# Patient Record
Sex: Female | Born: 1980 | Race: White | Hispanic: No | Marital: Single | Smoking: Former smoker
Health system: Southern US, Community
[De-identification: ages and names within clinical notes are randomized; demographics above are authoritative.]

## PROBLEM LIST (undated history)

## (undated) DIAGNOSIS — F32A Depression, unspecified: Secondary | ICD-10-CM

## (undated) DIAGNOSIS — F329 Major depressive disorder, single episode, unspecified: Secondary | ICD-10-CM

## (undated) DIAGNOSIS — F1011 Alcohol abuse, in remission: Secondary | ICD-10-CM

## (undated) DIAGNOSIS — M1990 Unspecified osteoarthritis, unspecified site: Secondary | ICD-10-CM

## (undated) DIAGNOSIS — F419 Anxiety disorder, unspecified: Secondary | ICD-10-CM

## (undated) HISTORY — DX: Alcohol abuse, in remission: F10.11

## (undated) HISTORY — DX: Unspecified osteoarthritis, unspecified site: M19.90

## (undated) HISTORY — DX: Major depressive disorder, single episode, unspecified: F32.9

## (undated) HISTORY — DX: Anxiety disorder, unspecified: F41.9

## (undated) HISTORY — PX: TUBAL LIGATION: SHX77

## (undated) HISTORY — DX: Depression, unspecified: F32.A

---
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
## 2004-08-11 ENCOUNTER — Emergency Department: Payer: Self-pay | Admitting: Emergency Medicine

## 2004-09-27 ENCOUNTER — Ambulatory Visit: Payer: Self-pay

## 2004-12-16 ENCOUNTER — Inpatient Hospital Stay: Payer: Self-pay

## 2004-12-25 ENCOUNTER — Ambulatory Visit: Payer: Self-pay | Admitting: Unknown Physician Specialty

## 2005-06-18 ENCOUNTER — Emergency Department: Payer: Self-pay | Admitting: Emergency Medicine

## 2005-08-16 ENCOUNTER — Emergency Department: Payer: Self-pay | Admitting: Emergency Medicine

## 2006-01-05 ENCOUNTER — Emergency Department: Payer: Self-pay | Admitting: Emergency Medicine

## 2006-01-07 ENCOUNTER — Ambulatory Visit: Payer: Self-pay | Admitting: Unknown Physician Specialty

## 2006-05-06 ENCOUNTER — Emergency Department: Payer: Self-pay | Admitting: Unknown Physician Specialty

## 2011-07-10 ENCOUNTER — Emergency Department: Payer: Self-pay | Admitting: Unknown Physician Specialty

## 2015-12-16 ENCOUNTER — Encounter: Payer: Self-pay | Admitting: Emergency Medicine

## 2015-12-16 ENCOUNTER — Emergency Department: Payer: Medicaid Other

## 2015-12-16 ENCOUNTER — Emergency Department
Admission: EM | Admit: 2015-12-16 | Discharge: 2015-12-16 | Disposition: A | Discharge status: Home / Self Care | Payer: Medicaid Other | Attending: Emergency Medicine | Admitting: Emergency Medicine

## 2015-12-16 DIAGNOSIS — F17200 Nicotine dependence, unspecified, uncomplicated: Secondary | ICD-10-CM | POA: Diagnosis not present

## 2015-12-16 DIAGNOSIS — Y999 Unspecified external cause status: Secondary | ICD-10-CM | POA: Diagnosis not present

## 2015-12-16 DIAGNOSIS — S82231A Displaced oblique fracture of shaft of right tibia, initial encounter for closed fracture: Secondary | ICD-10-CM | POA: Insufficient documentation

## 2015-12-16 DIAGNOSIS — X501XXA Overexertion from prolonged static or awkward postures, initial encounter: Secondary | ICD-10-CM | POA: Insufficient documentation

## 2015-12-16 DIAGNOSIS — Y929 Unspecified place or not applicable: Secondary | ICD-10-CM | POA: Insufficient documentation

## 2015-12-16 DIAGNOSIS — S8991XA Unspecified injury of right lower leg, initial encounter: Secondary | ICD-10-CM | POA: Diagnosis present

## 2015-12-16 DIAGNOSIS — S82201A Unspecified fracture of shaft of right tibia, initial encounter for closed fracture: Secondary | ICD-10-CM

## 2015-12-16 DIAGNOSIS — Y939 Activity, unspecified: Secondary | ICD-10-CM | POA: Insufficient documentation

## 2015-12-16 DIAGNOSIS — S82401A Unspecified fracture of shaft of right fibula, initial encounter for closed fracture: Secondary | ICD-10-CM

## 2015-12-16 DIAGNOSIS — S82431A Displaced oblique fracture of shaft of right fibula, initial encounter for closed fracture: Secondary | ICD-10-CM | POA: Insufficient documentation

## 2015-12-16 MED ORDER — HYDROMORPHONE HCL 1 MG/ML IJ SOLN
1.0000 mg | Freq: Once | INTRAMUSCULAR | Status: AC
  Administered 2015-12-16: 1 mg via INTRAMUSCULAR
  Filled 2015-12-16: qty 1

## 2015-12-16 MED ORDER — ONDANSETRON 4 MG PO TBDP
4.0000 mg | ORAL_TABLET | Freq: Once | ORAL | Status: AC
  Administered 2015-12-16: 4 mg via ORAL
  Filled 2015-12-16: qty 1

## 2015-12-16 MED ORDER — HYDROMORPHONE HCL 1 MG/ML IJ SOLN
1.0000 mg | Freq: Once | INTRAMUSCULAR | Status: AC
  Administered 2015-12-16: 1 mg via INTRAMUSCULAR

## 2015-12-16 MED ORDER — OXYCODONE-ACETAMINOPHEN 5-325 MG PO TABS: 1.0000 | ORAL_TABLET | Freq: Four times a day (QID) | ORAL | Status: DC | PRN

## 2015-12-16 MED ORDER — HYDROMORPHONE HCL 1 MG/ML IJ SOLN
INTRAMUSCULAR | Status: AC
  Administered 2015-12-16: 1 mg via INTRAMUSCULAR
  Filled 2015-12-16: qty 1

## 2015-12-16 NOTE — ED Provider Notes (Signed)
CSN: [id number]     Arrival date & time 12/16/15  1952 History   First MD Initiated Contact with Patient 12/16/15 2030     Chief Complaint  Patient presents with  . Leg Injury    rt     HPI  [age] year old female who presents to the emergency department for evaluation of right leg pain. She was getting off of her pony when her shoe got stuck and some fresh asphalt, the pony turned and caused her leg to twist but her foot remained planted. She has been unable to bear weight. She denies previous injury. She has not taken anything for pain. She ate just prior to the incident, which was approximately an hour ago.  No past medical history on file. No past surgical history on file. No family history on file. Social History  Substance Use Topics  . Smoking status: Current Every Day Smoker -- 1.00 packs/day  . Smokeless tobacco: Not on file  . Alcohol Use: No     Comment: socially   OB History    No data available     Review of Systems  Constitutional: Negative.   Respiratory: Negative.   Musculoskeletal: Positive for myalgias and gait problem.  Neurological: Negative.       Allergies  Review of patient's allergies indicates no known allergies.  Home Medications   Prior to Admission medications   Medication Sig Start Date End Date Taking? Authorizing Provider  oxyCODONE-acetaminophen (ROXICET) 5-325 MG tablet Take 1-2 tablets by mouth every 6 (six) hours as needed. 12/16/15   Eudell Julian B Tykira Wachs, FNP   BP 126/84 mmHg  Pulse 105  Temp(Src) 97.8 F (36.6 C)  Resp 18  Ht 5\' 2"  (1.575 m)  Wt 70.308 kg  BMI 28.34 kg/m2  SpO2 99%  LMP 12/10/2015 Physical Exam  Constitutional: She appears well-developed and well-nourished.  HENT:  Head: Atraumatic.  Neck: Normal range of motion.  Pulmonary/Chest: Effort normal.  Musculoskeletal: She exhibits edema and tenderness.       Right knee: She exhibits decreased range of motion and bony tenderness.       Right ankle: She exhibits  decreased range of motion and swelling. She exhibits no laceration and normal pulse. Tenderness. Proximal fibula tenderness found.       Legs: Vitals reviewed.   ED Course  .Splint Application Date/Time: 12/16/2015 9:48 PM Performed by: Maisie FusHOMAS, DAVID C Authorized by: Kem BoroughsRIPLETT, Rosiland Sen B Consent: Verbal consent obtained. Location details: right leg Splint type: sugar tong (Stirrup--knee to foot to knee) Post-procedure: The splinted body part was neurovascularly unchanged following the procedure.   (including critical care time) Labs Review Labs Reviewed - No data to display  Imaging Review Dg Tibia/fibula Right  12/16/2015  CLINICAL DATA:  Pain after getting off bony and twisting leg tonight. EXAM: RIGHT TIBIA AND FIBULA - 2 VIEW COMPARISON:  None. FINDINGS: There is a displaced oblique fracture of the proximal fibular diaphysis. There is also a displaced oblique fracture of the distal tibial diaphysis. There is 1/3 shaft's with of lateral displacement of the distal tibial fragment. Remainder the exam is within normal. IMPRESSION: Displaced oblique fracture of the distal tibial diaphysis and displaced oblique fracture of the proximal fibular diaphysis. Electronically Signed   By: Elberta Fortisaniel  Boyle M.D.   On: 12/16/2015 20:54   I have personally reviewed and evaluated these images and lab results as part of my medical decision-making. Initial fracture care was provided follow-up will be greater than 24 hours.   EKG  Interpretation None      MDM  Consulted with Dr. Rosita Kea who advises sugartong OCL and follow up Monday morning.  Radiology results and plan discussed with patient and family. She is aware to remain non weight bearing, elevate, and use ice. She will be given a prescription for Percocet to be taken as needed for pain. She is aware to call first thing Monday morning to be seen by Dr. Rosita Kea. Strict return precautions were given to the patient and her mother.  Final diagnoses:  Tibia  fracture, right, closed, initial encounter  Fibula fracture, right, closed, initial encounter        Chinita Pester, FNP 12/16/15 2154  Jeanmarie Plant, MD 12/16/15 2241

## 2015-12-16 NOTE — ED Notes (Signed)
Getting off horse and fell and foot twist on the ground and heard leg pop

## 2015-12-16 NOTE — Discharge Instructions (Signed)
Cast or Splint Care °Casts and splints support injured limbs and keep bones from moving while they heal.  °HOME CARE °· Keep the cast or splint uncovered during the drying period. °¨ A plaster cast can take 24 to 48 hours to dry. °¨ A fiberglass cast will dry in less than 1 hour. °· Do not rest the cast on anything harder than a pillow for 24 hours. °· Do not put weight on your injured limb. Do not put pressure on the cast. Wait for your doctor's approval. °· Keep the cast or splint dry. °¨ Cover the cast or splint with a plastic bag during baths or wet weather. °¨ If you have a cast over your chest and belly (trunk), take sponge baths until the cast is taken off. °¨ If your cast gets wet, dry it with a towel or blow dryer. Use the cool setting on the blow dryer. °· Keep your cast or splint clean. Wash a dirty cast with a damp cloth. °· Do not put any objects under your cast or splint. °· Do not scratch the skin under the cast with an object. If itching is a problem, use a blow dryer on a cool setting over the itchy area. °· Do not trim or cut your cast. °· Do not take out the padding from inside your cast. °· Exercise your joints near the cast as told by your doctor. °· Raise (elevate) your injured limb on 1 or 2 pillows for the first 1 to 3 days. °GET HELP IF: °· Your cast or splint cracks. °· Your cast or splint is too tight or too loose. °· You itch badly under the cast. °· Your cast gets wet or has a soft spot. °· You have a bad smell coming from the cast. °· You get an object stuck under the cast. °· Your skin around the cast becomes red or sore. °· You have new or more pain after the cast is put on. °GET HELP RIGHT AWAY IF: °· You have fluid leaking through the cast. °· You cannot move your fingers or toes. °· Your fingers or toes turn blue or white or are cool, painful, or puffy (swollen). °· You have tingling or lose feeling (numbness) around the injured area. °· You have bad pain or pressure under the  cast. °· You have trouble breathing or have shortness of breath. °· You have chest pain. °  °This information is not intended to replace advice given to you by your health care provider. Make sure you discuss any questions you have with your health care provider. °  °Document Released: 01/02/2011 Document Revised: 05/05/2013 Document Reviewed: 03/11/2013 °Elsevier Interactive Patient Education ©2016 [employer] ° °

## 2015-12-19 ENCOUNTER — Encounter
Admission: RE | Disposition: A | Discharge status: Home / Self Care | Payer: Self-pay | Source: Ambulatory Visit | Attending: Orthopedic Surgery

## 2015-12-19 ENCOUNTER — Ambulatory Visit: Payer: Medicaid Other | Admitting: Certified Registered Nurse Anesthetist

## 2015-12-19 ENCOUNTER — Ambulatory Visit: Payer: Medicaid Other

## 2015-12-19 ENCOUNTER — Encounter: Payer: Self-pay | Admitting: *Deleted

## 2015-12-19 ENCOUNTER — Observation Stay
Admission: RE | Admit: 2015-12-19 | Discharge: 2015-12-20 | Disposition: A | Discharge status: Home / Self Care | Payer: Medicaid Other | Source: Ambulatory Visit | Attending: Orthopedic Surgery | Admitting: Orthopedic Surgery

## 2015-12-19 DIAGNOSIS — F17210 Nicotine dependence, cigarettes, uncomplicated: Secondary | ICD-10-CM | POA: Insufficient documentation

## 2015-12-19 DIAGNOSIS — S82401A Unspecified fracture of shaft of right fibula, initial encounter for closed fracture: Secondary | ICD-10-CM | POA: Insufficient documentation

## 2015-12-19 DIAGNOSIS — S82201A Unspecified fracture of shaft of right tibia, initial encounter for closed fracture: Secondary | ICD-10-CM | POA: Diagnosis not present

## 2015-12-19 DIAGNOSIS — Z79891 Long term (current) use of opiate analgesic: Secondary | ICD-10-CM | POA: Diagnosis not present

## 2015-12-19 DIAGNOSIS — T148XXA Other injury of unspecified body region, initial encounter: Secondary | ICD-10-CM

## 2015-12-19 HISTORY — PX: TIBIA IM NAIL INSERTION: SHX2516

## 2015-12-19 LAB — CBC
HCT: 38.5 % (ref 35.0–47.0)
Hemoglobin: 13.1 g/dL (ref 12.0–16.0)
MCH: 34.1 pg — ABNORMAL HIGH (ref 26.0–34.0)
MCHC: 34.1 g/dL (ref 32.0–36.0)
MCV: 99.8 fL (ref 80.0–100.0)
PLATELETS: 273 10*3/uL (ref 150–440)
RBC: 3.86 MIL/uL (ref 3.80–5.20)
RDW: 12.8 % (ref 11.5–14.5)
WBC: 15.6 10*3/uL — AB (ref 3.6–11.0)

## 2015-12-19 LAB — CREATININE, SERUM
Creatinine, Ser: 0.64 mg/dL (ref 0.44–1.00)
GFR calc non Af Amer: >60 mL/min (ref >60)

## 2015-12-19 LAB — POCT PREGNANCY, URINE: PREG TEST UR: NEGATIVE

## 2015-12-19 SURGERY — INSERTION, INTRAMEDULLARY ROD, TIBIA
Anesthesia: General | Site: Leg Lower | Laterality: Right | Wound class: Clean

## 2015-12-19 MED ORDER — HYDROMORPHONE HCL 1 MG/ML IJ SOLN
INTRAMUSCULAR | Status: AC
  Administered 2015-12-19: 0.25 mg via INTRAVENOUS
  Filled 2015-12-19: qty 1

## 2015-12-19 MED ORDER — ONDANSETRON HCL 4 MG PO TABS: 4.0000 mg | ORAL_TABLET | Freq: Four times a day (QID) | ORAL | Status: DC | PRN

## 2015-12-19 MED ORDER — MIDAZOLAM HCL 5 MG/5ML IJ SOLN
INTRAMUSCULAR | Status: AC
Start: 2015-12-19 — End: 2015-12-20
  Filled 2015-12-19: qty 5

## 2015-12-19 MED ORDER — CEFAZOLIN SODIUM-DEXTROSE 2-4 GM/100ML-% IV SOLN
2.0000 g | Freq: Once | INTRAVENOUS | Status: AC
  Administered 2015-12-19: 2 g via INTRAVENOUS

## 2015-12-19 MED ORDER — CEFAZOLIN SODIUM-DEXTROSE 2-4 GM/100ML-% IV SOLN
INTRAVENOUS | Status: AC
  Filled 2015-12-19: qty 100

## 2015-12-19 MED ORDER — FENTANYL CITRATE (PF) 100 MCG/2ML IJ SOLN
INTRAMUSCULAR | Status: AC
  Administered 2015-12-19: 25 ug via INTRAVENOUS
  Filled 2015-12-19: qty 2

## 2015-12-19 MED ORDER — DEXTROSE 5 % IV SOLN: 500.0000 mg | Freq: Four times a day (QID) | INTRAVENOUS | Status: DC | PRN

## 2015-12-19 MED ORDER — ZOLPIDEM TARTRATE 5 MG PO TABS: 5.0000 mg | ORAL_TABLET | Freq: Every evening | ORAL | Status: DC | PRN

## 2015-12-19 MED ORDER — ACETAMINOPHEN 500 MG PO TABS
1000.0000 mg | ORAL_TABLET | Freq: Four times a day (QID) | ORAL | Status: DC
  Administered 2015-12-19 – 2015-12-20 (×3): 1000 mg via ORAL
  Filled 2015-12-19 (×3): qty 2

## 2015-12-19 MED ORDER — ACETAMINOPHEN 10 MG/ML IV SOLN
INTRAVENOUS | Status: AC
  Filled 2015-12-19: qty 100

## 2015-12-19 MED ORDER — ONDANSETRON HCL 4 MG/2ML IJ SOLN: 4.0000 mg | Freq: Four times a day (QID) | INTRAMUSCULAR | Status: DC | PRN

## 2015-12-19 MED ORDER — CEFAZOLIN SODIUM 1-5 GM-% IV SOLN
1.0000 g | Freq: Four times a day (QID) | INTRAVENOUS | Status: AC
  Administered 2015-12-19 – 2015-12-20 (×3): 1 g via INTRAVENOUS
  Filled 2015-12-19 (×3): qty 50

## 2015-12-19 MED ORDER — ACETAMINOPHEN 650 MG RE SUPP: 650.0000 mg | Freq: Four times a day (QID) | RECTAL | Status: DC | PRN

## 2015-12-19 MED ORDER — NICOTINE 14 MG/24HR TD PT24
14.0000 mg | MEDICATED_PATCH | Freq: Every day | TRANSDERMAL | Status: DC
  Administered 2015-12-19: 14 mg via TRANSDERMAL
  Filled 2015-12-19 (×2): qty 1

## 2015-12-19 MED ORDER — FAMOTIDINE 20 MG PO TABS
ORAL_TABLET | ORAL | Status: AC
  Administered 2015-12-19: 20 mg via ORAL
  Filled 2015-12-19: qty 1

## 2015-12-19 MED ORDER — OXYCODONE HCL 5 MG PO TABS
5.0000 mg | ORAL_TABLET | ORAL | Status: DC | PRN
  Administered 2015-12-19 – 2015-12-20 (×6): 10 mg via ORAL
  Filled 2015-12-19 (×6): qty 2

## 2015-12-19 MED ORDER — METHOCARBAMOL 500 MG PO TABS
500.0000 mg | ORAL_TABLET | Freq: Four times a day (QID) | ORAL | Status: DC | PRN
Start: 2015-12-19 — End: 2015-12-20

## 2015-12-19 MED ORDER — SODIUM CHLORIDE 0.9 % IV SOLN
INTRAVENOUS | Status: DC
  Administered 2015-12-20: 03:00:00 via INTRAVENOUS

## 2015-12-19 MED ORDER — ACETAMINOPHEN 325 MG PO TABS: 650.0000 mg | ORAL_TABLET | Freq: Four times a day (QID) | ORAL | Status: DC | PRN

## 2015-12-19 MED ORDER — DEXAMETHASONE SODIUM PHOSPHATE 10 MG/ML IJ SOLN
INTRAMUSCULAR | Status: DC | PRN
  Administered 2015-12-19: 10 mg via INTRAVENOUS

## 2015-12-19 MED ORDER — FAMOTIDINE 20 MG PO TABS
20.0000 mg | ORAL_TABLET | Freq: Once | ORAL | Status: AC
  Administered 2015-12-19: 20 mg via ORAL

## 2015-12-19 MED ORDER — PROPOFOL 10 MG/ML IV BOLUS
INTRAVENOUS | Status: DC | PRN
  Administered 2015-12-19: 150 mg via INTRAVENOUS

## 2015-12-19 MED ORDER — ONDANSETRON HCL 4 MG/2ML IJ SOLN
INTRAMUSCULAR | Status: DC | PRN
  Administered 2015-12-19: 4 mg via INTRAVENOUS

## 2015-12-19 MED ORDER — ACETAMINOPHEN 10 MG/ML IV SOLN
INTRAVENOUS | Status: DC | PRN
  Administered 2015-12-19: 1000 mg via INTRAVENOUS

## 2015-12-19 MED ORDER — ONDANSETRON HCL 4 MG/2ML IJ SOLN: 4.0000 mg | Freq: Once | INTRAMUSCULAR | Status: DC | PRN

## 2015-12-19 MED ORDER — DIPHENHYDRAMINE HCL 12.5 MG/5ML PO ELIX: 12.5000 mg | ORAL_SOLUTION | ORAL | Status: DC | PRN

## 2015-12-19 MED ORDER — METOCLOPRAMIDE HCL 5 MG/ML IJ SOLN: 5.0000 mg | Freq: Three times a day (TID) | INTRAMUSCULAR | Status: DC | PRN

## 2015-12-19 MED ORDER — MORPHINE SULFATE (PF) 2 MG/ML IV SOLN: 2.0000 mg | INTRAVENOUS | Status: DC | PRN

## 2015-12-19 MED ORDER — ENOXAPARIN SODIUM 40 MG/0.4ML ~~LOC~~ SOLN
40.0000 mg | SUBCUTANEOUS | Status: DC
  Administered 2015-12-20: 40 mg via SUBCUTANEOUS
  Filled 2015-12-19: qty 0.4

## 2015-12-19 MED ORDER — MIDAZOLAM HCL 10 MG/10ML IJ SOLN
1.0000 mg | Freq: Once | INTRAMUSCULAR | Status: AC
  Administered 2015-12-19: 1 mg via INTRAVENOUS

## 2015-12-19 MED ORDER — FENTANYL CITRATE (PF) 100 MCG/2ML IJ SOLN
25.0000 ug | INTRAMUSCULAR | Status: DC | PRN
  Administered 2015-12-19 (×4): 25 ug via INTRAVENOUS

## 2015-12-19 MED ORDER — HYDROMORPHONE HCL 1 MG/ML IJ SOLN
0.2500 mg | INTRAMUSCULAR | Status: DC | PRN
  Administered 2015-12-19 (×5): 0.25 mg via INTRAVENOUS

## 2015-12-19 MED ORDER — FENTANYL CITRATE (PF) 100 MCG/2ML IJ SOLN
INTRAMUSCULAR | Status: DC | PRN
  Administered 2015-12-19 (×4): 50 ug via INTRAVENOUS

## 2015-12-19 MED ORDER — DOCUSATE SODIUM 100 MG PO CAPS
100.0000 mg | ORAL_CAPSULE | Freq: Two times a day (BID) | ORAL | Status: DC
  Administered 2015-12-19 – 2015-12-20 (×2): 100 mg via ORAL
  Filled 2015-12-19 (×2): qty 1

## 2015-12-19 MED ORDER — NEOMYCIN-POLYMYXIN B GU 40-200000 IR SOLN
Status: AC
  Filled 2015-12-19: qty 4

## 2015-12-19 MED ORDER — MIDAZOLAM HCL 2 MG/2ML IJ SOLN
INTRAMUSCULAR | Status: DC | PRN
  Administered 2015-12-19: 2 mg via INTRAVENOUS

## 2015-12-19 MED ORDER — MAGNESIUM HYDROXIDE 400 MG/5ML PO SUSP: 30.0000 mL | Freq: Every day | ORAL | Status: DC | PRN

## 2015-12-19 MED ORDER — LACTATED RINGERS IV SOLN
INTRAVENOUS | Status: DC
  Administered 2015-12-19: 12:00:00 via INTRAVENOUS

## 2015-12-19 MED ORDER — METOCLOPRAMIDE HCL 10 MG PO TABS: 5.0000 mg | ORAL_TABLET | Freq: Three times a day (TID) | ORAL | Status: DC | PRN

## 2015-12-19 SURGICAL SUPPLY — 33 items
BIT DRILL CAL 3.2 LONG (BIT) ×2 IMPLANT
BIT DRILL CAL 3.2MM LONG (BIT) ×1
BIT DRILL SHORT 3.2MM (DRILL) ×1 IMPLANT
BLADE CLIPPER SURG (BLADE) ×3 IMPLANT
CANISTER SUCT 1200ML W/VALVE (MISCELLANEOUS) ×3 IMPLANT
CAP END 5MM TI GRAY (Cap) ×1 IMPLANT
CAP END TI GRAY 5MM (Cap) ×2 IMPLANT
CHLORAPREP W/TINT 26ML (MISCELLANEOUS) ×3 IMPLANT
DRAPE C-ARM XRAY 36X54 (DRAPES) ×3 IMPLANT
DRAPE C-ARMOR (DRAPES) ×3 IMPLANT
DRILL SHORT 3.2MM (DRILL) ×3
ELECT CAUTERY BLADE 6.4 (BLADE) ×3 IMPLANT
ELECT REM PT RETURN 9FT ADLT (ELECTROSURGICAL) ×3
ELECTRODE REM PT RTRN 9FT ADLT (ELECTROSURGICAL) ×1 IMPLANT
GAUZE PETRO XEROFOAM 1X8 (MISCELLANEOUS) ×3 IMPLANT
GAUZE SPONGE 4X4 12PLY STRL (GAUZE/BANDAGES/DRESSINGS) ×6 IMPLANT
GLOVE BIOGEL PI IND STRL 9 (GLOVE) ×1 IMPLANT
GLOVE BIOGEL PI INDICATOR 9 (GLOVE) ×2
GLOVE SURG ORTHO 9.0 STRL STRW (GLOVE) ×3 IMPLANT
GOWN STRL REUS W/ TWL LRG LVL3 (GOWN DISPOSABLE) ×1 IMPLANT
GOWN STRL REUS W/TWL LRG LVL3 (GOWN DISPOSABLE) ×2
GOWN SURG XXL (GOWNS) ×3 IMPLANT
KIT RM TURNOVER STRD PROC AR (KITS) ×3 IMPLANT
NAIL TIB 8X300 (Nail) ×3 IMPLANT
NS IRRIG 1000ML POUR BTL (IV SOLUTION) ×3 IMPLANT
PACK TOTAL KNEE (MISCELLANEOUS) ×3 IMPLANT
REAMER ROD DEEP FLUTE 2.5X950 (INSTRUMENTS) ×3 IMPLANT
SCREW LOCK 4X36 TI (Screw) ×3 IMPLANT
SCREW LOCKING 4.0 28MM (Screw) ×3 IMPLANT
SCREW LOCKING 4.0 40MM (Screw) ×3 IMPLANT
STAPLER SKIN PROX 35W (STAPLE) ×3 IMPLANT
SUT VIC AB 0 CT1 36 (SUTURE) ×3 IMPLANT
SUT VIC AB 2-0 CT1 (SUTURE) ×3 IMPLANT

## 2015-12-19 NOTE — Op Note (Signed)
12/19/2015  2:55 PM  PATIENT:  Vernard GamblesAngel D Tilghman  35 y.o. female  PRE-OPERATIVE DIAGNOSIS:  CLOSED FRACTURE RIGHT TIBIA AND FIBULA  POST-OPERATIVE DIAGNOSIS:  CLOSED FRACTURE RIGHT TIBIA AND FIBULA  PROCEDURE:  Procedure(s): INTRAMEDULLARY (IM) NAIL TIBIAL (Right)  SURGEON: Leitha SchullerMichael J Essa Wenk, MD  ASSISTANTS: None  ANESTHESIA:   general  EBL:  Total I/O In: 700 [I.V.:700] Out: 50 [Blood:50]  BLOOD ADMINISTERED:none  DRAINS: none   LOCAL MEDICATIONS USED:  NONE  SPECIMEN:  No Specimen  DISPOSITION OF SPECIMEN:  N/A  COUNTS:  YES  TOURNIQUET:   64 minutes at 300 mmHg  IMPLANTS: Synthes 8 x 300 tibial nail BX with 5 mm end cap and 3 interlocking screws  DICTATION: .Dragon Dictation patient brought the operating room and after adequate anesthesia was obtained the right leg was prepped and draped in sterile fashion was turned by the upper thigh after patient identification and timeout procedures were completed, tourniquet was raised. C-arm was brought in and good visualization of the intercondylar eminences was obtained and a midline skin incision was made over the patella followed by patella splitting incision. The awl was used to make a starting hole a guide were inserted down the canal. The fracture was held reduced and the guidewire passed across the fracture site next the reaming was carried out with the 8/2 mm reamer the smallest but this did not cross the fracture site was bound up in the isthmus. The guidewires removed and sequential hand reaming was carried out with the 67 and 8 mm hand T-handle reamers. Next the 8.5 mm reamer was able to be crossed across the fracture site and went up to 9-1/2 mm on reaming for placement of an 8 mm rod. Length was determined by the guidewire and the nail was advanced as the fracture is held in reduced position. With the fracture reduced and the rod placed down to the fascial scar distally the proximal interlocking screw was placed through the  dynamic hole to allow for some compression. The insertion handle was then removed and a 5 mm end cap placed. Going distally perfect circle technique was used with freehand style to place 2 interlocking screws from medial to lateral. When this was completed permanent C-arm views were obtained proximal distal the wounds were irrigated and then closed with 0 Vicryl for the patellar tendon to elective substantially and skin staples the other wounds are closed with this and skin staples Xeroform 4 x 4 web roll and Ace wrap applied  PLAN OF CARE: Admit for overnight observation  PATIENT DISPOSITION:  PACU - hemodynamically stable.

## 2015-12-19 NOTE — Anesthesia Preprocedure Evaluation (Signed)
Anesthesia Evaluation  Patient identified by MRN, date of birth, ID band Patient awake    Reviewed: Allergy & Precautions, H&P , NPO status , Patient's Chart, lab work & pertinent test results, reviewed documented beta blocker date and time   Airway Mallampati: II  TM Distance: >3 FB Neck ROM: full    Dental  (+) Teeth Intact   Pulmonary neg pulmonary ROS, Current Smoker,    Pulmonary exam normal        Cardiovascular Exercise Tolerance: Good negative cardio ROS Normal cardiovascular exam Rate:Normal     Neuro/Psych negative neurological ROS  negative psych ROS   GI/Hepatic negative GI ROS, Neg liver ROS,   Endo/Other  negative endocrine ROS  Renal/GU negative Renal ROS  negative genitourinary   Musculoskeletal   Abdominal   Peds  Hematology negative hematology ROS (+)   Anesthesia Other Findings   Reproductive/Obstetrics negative OB ROS                             Anesthesia Physical Anesthesia Plan  ASA: II  Anesthesia Plan: General LMA   Post-op Pain Management:    Induction:   Airway Management Planned:   Additional Equipment:   Intra-op Plan:   Post-operative Plan:   Informed Consent: I have reviewed the patients History and Physical, chart, labs and discussed the procedure including the risks, benefits and alternatives for the proposed anesthesia with the patient or authorized representative who has indicated his/her understanding and acceptance.     Plan Discussed with: CRNA  Anesthesia Plan Comments:         Anesthesia Quick Evaluation  

## 2015-12-19 NOTE — Progress Notes (Signed)
Applied scd and ted hose to left leg

## 2015-12-19 NOTE — Progress Notes (Signed)
Pt states she is very tense  Versed one mg given

## 2015-12-19 NOTE — H&P (Signed)
Reviewed paper H+P, will be scanned into chart. No changes noted.  

## 2015-12-19 NOTE — Progress Notes (Signed)
Less tense and feeling a little better

## 2015-12-19 NOTE — Anesthesia Procedure Notes (Signed)
Procedure Name: LMA Insertion Date/Time: 12/19/2015 1:06 PM Performed by: Omer JackWEATHERLY, Toni Clagett Pre-anesthesia Checklist: Patient identified, Patient being monitored, Timeout performed, Emergency Drugs available and Suction available Patient Re-evaluated:Patient Re-evaluated prior to inductionOxygen Delivery Method: Circle system utilized Preoxygenation: Pre-oxygenation with 100% oxygen Intubation Type: IV induction Ventilation: Mask ventilation without difficulty LMA: LMA inserted LMA Size: 4.0 Tube type: Oral Number of attempts: 1 Placement Confirmation: positive ETCO2 and breath sounds checked- equal and bilateral Tube secured with: Tape Dental Injury: Teeth and Oropharynx as per pre-operative assessment

## 2015-12-19 NOTE — Transfer of Care (Signed)
Immediate Anesthesia Transfer of Care Note  Patient: Toni GamblesAngel D Glock  Procedure(s) Performed: Procedure(s): INTRAMEDULLARY (IM) NAIL TIBIAL (Right)  Patient Location: PACU  Anesthesia Type:General  Level of Consciousness: sedated and responds to stimulation  Airway & Oxygen Therapy: Patient Spontanous Breathing and Patient connected to face mask oxygen  Post-op Assessment: Report given to RN and Post -op Vital signs reviewed and stable  Post vital signs: Reviewed and stable  Last Vitals:  Filed Vitals:   12/19/15 1204 12/19/15 1450  BP: 129/68 124/88  Pulse: 88 67  Temp: 36.8 C 36.8 C  Resp: 18 10    Complications: No apparent anesthesia complications

## 2015-12-19 NOTE — H&P (Signed)
Subjective:   Patient is a 35 y.o. female presents with right leg pain. Onset of symptoms was abrupt starting 3 days ago with unchanged course since that time. The pain is located in the lower leg. Patient describes the pain as sharp bedtimes achy all the time continuous and rated as severe. Pain has been associated with a fall she got off of bony she planted her foot twisting her body over the foot and causing a leg fracture. She came to the emergency room was splinted and sent home. She was seen in the office yesterday and now comes in for ORIF. Marland Kitchen. Past history includes no prior problems with this leg.  Previous studies include x-rays done in emergency room showing a right tibia with associated fibular fracture.  There are no active problems to display for this patient.  History reviewed. No pertinent past medical history.  History reviewed. No pertinent past surgical history.  Prescriptions prior to admission  Medication Sig Dispense Refill Last Dose  . oxyCODONE-acetaminophen (ROXICET) 5-325 MG tablet Take 1-2 tablets by mouth every 6 (six) hours as needed. 20 tablet 0 12/19/2015 at 1000   No Known Allergies  Social History  Substance Use Topics  . Smoking status: Current Every Day Smoker -- 1.00 packs/day  . Smokeless tobacco: Not on file  . Alcohol Use: No     Comment: socially    History reviewed. No pertinent family history.  Review of Systems Pertinent items are noted in HPI.  Objective:   Patient Vitals for the past 8 hrs:  BP Temp Temp src Pulse Resp SpO2 Height Weight  12/19/15 1204 129/68 mmHg 98.2 F (36.8 C) Oral 88 18 100 % 5\' 2"  (1.575 m) 70.308 kg (155 lb)          BP 129/68 mmHg  Pulse 88  Temp(Src) 98.2 F (36.8 C) (Oral)  Resp 18  Ht 5\' 2"  (1.575 m)  Wt 70.308 kg (155 lb)  BMI 28.34 kg/m2  SpO2 100%  LMP 12/10/2015 General appearance: alert, cooperative and mild distress Neck: no adenopathy and supple, symmetrical, trachea midline Lungs: clear to  auscultation bilaterally Heart: regular rate and rhythm, S1, S2 normal, no murmur, click, rub or gallop Extremities: Right leg is in a long leg splint with the foot externally rotated compared to the knee. Sensation is intact to the toes plantar and dorsal aspect with brisk capillary refill right leg Pulses: 2+ and symmetric  Data ReviewRadiology review: Proximal fibula and distal shaft tibia fracture with some comminution  Assessment:   Active Problems:   * No active hospital problems. *  right tibia and fibula fracture displaced  Plan:   ORIF with intramedullary rod with proximal distal locking screws, plan an overnight stay for pain control and postoperative antibiotics

## 2015-12-20 DIAGNOSIS — S82201A Unspecified fracture of shaft of right tibia, initial encounter for closed fracture: Secondary | ICD-10-CM | POA: Diagnosis not present

## 2015-12-20 MED ORDER — ASPIRIN EC 325 MG PO TBEC: 325.0000 mg | DELAYED_RELEASE_TABLET | Freq: Every day | ORAL | Status: DC

## 2015-12-20 MED ORDER — OXYCODONE HCL 5 MG PO TABS: 5.0000 mg | ORAL_TABLET | ORAL | Status: DC | PRN

## 2015-12-20 MED ORDER — ONDANSETRON HCL 4 MG PO TABS: 4.0000 mg | ORAL_TABLET | Freq: Four times a day (QID) | ORAL | Status: DC | PRN

## 2015-12-20 NOTE — Care Management (Signed)
Lives with her children. Parents live "[place] the road". She is not concerned about discharge to home today. PT pending. She has 10-11 stairs at home but has been going up them with crutches prior to this admission. No RNCM needs per patient. Please let me know if that changes. Case closed.

## 2015-12-20 NOTE — Progress Notes (Signed)
   Subjective: 1 Day Post-Op Procedure(s) (LRB): INTRAMEDULLARY (IM) NAIL TIBIAL (Right) Patient reports pain as mild.   Patient is well, and has had no acute complaints or problems Denies any CP, SOB, ABD pain. We will continue therapy today.  Plan is to go Home after hospital stay.  Objective: Vital signs in last 24 hours: Temp:  [97.5 F (36.4 C)-99.2 F (37.3 C)] 97.5 F (36.4 C) (04/05 0741) Pulse Rate:  [59-126] 62 (04/05 0741) Resp:  [10-18] 18 (04/05 0741) BP: (107-148)/(68-90) 112/68 mmHg (04/05 0741) SpO2:  [92 %-100 %] 100 % (04/05 0741) FiO2 (%):  [21 %] 21 % (04/04 1632) Weight:  [70.308 kg (155 lb)-75.433 kg (166 lb 4.8 oz)] 75.433 kg (166 lb 4.8 oz) (04/04 1703)  Intake/Output from previous day: 04/04 0701 - 04/05 0700 In: 1260 [P.O.:360; I.V.:900] Out: 220 [Urine:170; Blood:50] Intake/Output this shift:     Recent Labs  12/19/15 1727  HGB 13.1    Recent Labs  12/19/15 1727  WBC 15.6*  RBC 3.86  HCT 38.5  PLT 273    Recent Labs  12/19/15 1727  CREATININE 0.64   No results for input(s): LABPT, INR in the last 72 hours.  EXAM General - Patient is Alert, Appropriate and Oriented Extremity - Neurologically intact Neurovascular intact Sensation intact distally Intact pulses distally Dressing - dressing C/D/I and no drainage Motor Function - intact, moving foot and toes well on exam.   History reviewed. No pertinent past medical history.  Assessment/Plan:   1 Day Post-Op Procedure(s) (LRB): INTRAMEDULLARY (IM) NAIL TIBIAL (Right) Active Problems:   Closed right tibial fracture   Right tibial fracture  Estimated body mass index is 30.41 kg/(m^2) as calculated from the following:   Height as of this encounter: 5\' 2"  (1.575 m).   Weight as of this encounter: 75.433 kg (166 lb 4.8 oz). Advance diet Up with therapy  Plan on discharge to home today, follow up with [facility] in 2 days for dressing change  DVT Prophylaxis -  Lovenox Partial Weight-Bearing as tolerated to right leg D/C O2 and Pulse OX and try on Room Air  T. Cranston Neighborhris Gaines, PA-C [facility] 12/20/2015, 8:10 AM

## 2015-12-20 NOTE — Discharge Instructions (Signed)
Diet: As you were doing prior to hospitalization   Dressing:  We will change dressing in 2 days.  Keep clean and dry at all times  Activity:  Increase activity slowly as tolerated, but follow the weight bearing instructions below.  No lifting or driving for 6 weeks.  Weight Bearing:   Partial Weight bearing as tolerated to right lower extremity  To prevent constipation: you may use a stool softener such as -  Colace (over the counter) 100 mg by mouth twice a day  Drink plenty of fluids (prune juice may be helpful) and high fiber foods Miralax (over the counter) for constipation as needed.    Itching:  If you experience itching with your medications, try taking only a single pain pill, or even half a pain pill at a time.  You may take up to 10 pain pills per day, and you can also use benadryl over the counter for itching or also to help with sleep.   Precautions:  If you experience chest pain or shortness of breath - call 911 immediately for transfer to the hospital emergency department!!  If you develop a fever greater that 101 F, purulent drainage from wound, increased redness or drainage from wound, or calf pain-Call [facility]                                              Follow- Up Appointment:  [facility]
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
## 2015-12-20 NOTE — Discharge Summary (Signed)
Physician Discharge Summary  Patient ID: Toni Friedman MRN: [id number] DOB/AGE: 36/01/1981 36 y.o.  Admit date: 12/19/2015 Discharge date: 12/20/2015  Admission Diagnoses:  CLOSED FRACTURE RIGHT TIBIA AND FIBULA   Discharge Diagnoses: Patient Active Problem List   Diagnosis Date Noted  . Right tibial fracture 12/20/2015  . Closed right tibial fracture 12/19/2015    History reviewed. No pertinent past medical history.   Transfusion: none   Consultants (if any):    Discharged Condition: Improved  Hospital Course: Toni Gamblesngel D Bleecker is an 35 y.o. female who was admitted 12/19/2015 with a diagnosis of <principal problem not specified> and went to the operating room on 12/19/2015 and underwent the above named procedures.    Surgeries: Procedure(s): INTRAMEDULLARY (IM) NAIL TIBIAL on 12/19/2015 Patient tolerated the surgery well. Taken to PACU where she was stabilized and then transferred to the orthopedic floor.  Started on Lovenox 40 q 24 hrs. Foot pumps applied bilaterally at 80 mm. Heels elevated on bed with rolled towels. No evidence of DVT. Negative Homan. Physical therapy started on day #1 for gait training and transfer. OT started day #1 for ADL and assisted devices.  Patient's foley was d/c on day #1. Patient's IV was d/c on day #2.  On post op day #2 patient was stable and ready for discharge to home.  Implants: Synthes 8 x 300 tibial nail BX with 5 mm end cap and 3 interlocking screws  She was given perioperative antibiotics:  Anti-infectives    Start     Dose/Rate Route Frequency Ordered Stop   12/19/15 1900  ceFAZolin (ANCEF) IVPB 1 g/50 mL premix     1 g 100 mL/hr over 30 Minutes Intravenous Every 6 hours 12/19/15 1631 12/20/15 0629   12/19/15 1200  ceFAZolin (ANCEF) IVPB 2g/100 mL premix     2 g 200 mL/hr over 30 Minutes Intravenous  Once 12/19/15 1156 12/19/15 1311   12/19/15 1158  ceFAZolin (ANCEF) 2-4 GM/100ML-% IVPB    Comments:  Eli HoseBuchanan, Leslie: cabinet  override      12/19/15 1158 12/19/15 2359    .  She was given sequential compression devices, early ambulation, and aspirin for DVT prophylaxis.  She benefited maximally from the hospital stay and there were no complications.    Recent vital signs:  Filed Vitals:   12/20/15 0428 12/20/15 0741  BP: 107/69 112/68  Pulse: 59 62  Temp: 98.4 F (36.9 C) 97.5 F (36.4 C)  Resp: 18 18    Recent laboratory studies:  Lab Results  Component Value Date   HGB 13.1 12/19/2015   Lab Results  Component Value Date   WBC 15.6* 12/19/2015   PLT 273 12/19/2015   No results found for: INR Lab Results  Component Value Date   CREATININE 0.64 12/19/2015    Discharge Medications:     Medication List    TAKE these medications        aspirin EC 325 MG tablet  Take 1 tablet (325 mg total) by mouth daily.     ondansetron 4 MG tablet  Commonly known as:  ZOFRAN  Take 1 tablet (4 mg total) by mouth every 6 (six) hours as needed for nausea.     oxyCODONE 5 MG immediate release tablet  Commonly known as:  Oxy IR/ROXICODONE  Take 1-2 tablets (5-10 mg total) by mouth every 3 (three) hours as needed for breakthrough pain.     oxyCODONE-acetaminophen 5-325 MG tablet  Commonly known as:  ROXICET  Take 1-2  tablets by mouth every 6 (six) hours as needed.        Diagnostic Studies: Dg Tibia/fibula Right  12/19/2015  CLINICAL DATA:  Screw and nail fixation for fracture EXAM: RIGHT TIBIA AND FIBULA - 2 VIEW; DG C-ARM 61-120 MIN COMPARISON:  Preoperative study December 16, 2015 FLUOROSCOPY TIME:  1 minutes 7 seconds; 4 submitted images FINDINGS: Frontal and lateral views were obtained. There is screw and nail fixation through an obliquely oriented fracture of the distal tibia. There remains slight lateral displacement distal fracture fragment with respect proximal fragment. The known fracture of the proximal fibula is not well seen on this study. No new fracture. No dislocation. The joint spaces appear  unremarkable. IMPRESSION: Surgical fixation through a fracture of the distal tibia with alignment near anatomic. There does remain slight lateral displacement of the distal fracture fragment with respect proximal fragment. The known fracture of the proximal fibula is not well seen on this study. No new fracture. No dislocation. Joint spaces appear normal. Electronically Signed   By: Bretta Bang III M.D.   On: 12/19/2015 14:45   Dg Tibia/fibula Right  12/16/2015  CLINICAL DATA:  Pain after getting off bony and twisting leg tonight. EXAM: RIGHT TIBIA AND FIBULA - 2 VIEW COMPARISON:  None. FINDINGS: There is a displaced oblique fracture of the proximal fibular diaphysis. There is also a displaced oblique fracture of the distal tibial diaphysis. There is 1/3 shaft's with of lateral displacement of the distal tibial fragment. Remainder the exam is within normal. IMPRESSION: Displaced oblique fracture of the distal tibial diaphysis and displaced oblique fracture of the proximal fibular diaphysis. Electronically Signed   By: Elberta Fortis M.D.   On: 12/16/2015 20:54   Dg C-arm 61-120 Min  12/19/2015  CLINICAL DATA:  Screw and nail fixation for fracture EXAM: RIGHT TIBIA AND FIBULA - 2 VIEW; DG C-ARM 61-120 MIN COMPARISON:  Preoperative study December 16, 2015 FLUOROSCOPY TIME:  1 minutes 7 seconds; 4 submitted images FINDINGS: Frontal and lateral views were obtained. There is screw and nail fixation through an obliquely oriented fracture of the distal tibia. There remains slight lateral displacement distal fracture fragment with respect proximal fragment. The known fracture of the proximal fibula is not well seen on this study. No new fracture. No dislocation. The joint spaces appear unremarkable. IMPRESSION: Surgical fixation through a fracture of the distal tibia with alignment near anatomic. There does remain slight lateral displacement of the distal fracture fragment with respect proximal fragment. The known  fracture of the proximal fibula is not well seen on this study. No new fracture. No dislocation. Joint spaces appear normal. Electronically Signed   By: Bretta Bang III M.D.   On: 12/19/2015 14:45    Disposition: 01-Home or Self Care        Follow-up Information    Follow up with MENZ,MICHAEL, MD In 2 days.   Specialty:  Orthopedic Surgery   Why:  For wound re-check, dressing change   Contact information:   [address] [facility] [place] [address] [phone]        Signed: Amador Cunas [facility] 12/20/2015, 8:16 AM

## 2015-12-21 NOTE — Anesthesia Postprocedure Evaluation (Signed)
Anesthesia Post Note  Patient: Toni Friedman  Procedure(s) Performed: Procedure(s) (LRB): INTRAMEDULLARY (IM) NAIL TIBIAL (Right)  Patient location during evaluation: PACU Anesthesia Type: General Level of consciousness: awake and alert Pain management: pain level controlled Vital Signs Assessment: post-procedure vital signs reviewed and stable Respiratory status: spontaneous breathing, nonlabored ventilation, respiratory function stable and patient connected to nasal cannula oxygen Cardiovascular status: blood pressure returned to baseline and stable Postop Assessment: no signs of nausea or vomiting Anesthetic complications: no    Last Vitals:  Filed Vitals:   12/20/15 0428 12/20/15 0741  BP: 107/69 112/68  Pulse: 59 62  Temp: 36.9 C 36.4 C  Resp: 18 18    Last Pain:  Filed Vitals:   12/20/15 1150  PainSc: 2                  Yevette EdwardsJames G Fidelis Loth

## 2016-05-07 ENCOUNTER — Emergency Department
Admission: EM | Admit: 2016-05-07 | Discharge: 2016-05-07 | Disposition: A | Discharge status: Home / Self Care | Payer: Medicaid Other | Attending: Student in an Organized Health Care Education/Training Program | Admitting: Student in an Organized Health Care Education/Training Program

## 2016-05-07 ENCOUNTER — Encounter: Payer: Self-pay | Admitting: Medical Oncology

## 2016-05-07 ENCOUNTER — Emergency Department: Payer: Medicaid Other

## 2016-05-07 DIAGNOSIS — Z79899 Other long term (current) drug therapy: Secondary | ICD-10-CM | POA: Insufficient documentation

## 2016-05-07 DIAGNOSIS — F17200 Nicotine dependence, unspecified, uncomplicated: Secondary | ICD-10-CM | POA: Diagnosis not present

## 2016-05-07 DIAGNOSIS — M79604 Pain in right leg: Secondary | ICD-10-CM | POA: Diagnosis present

## 2016-05-07 DIAGNOSIS — Z7982 Long term (current) use of aspirin: Secondary | ICD-10-CM | POA: Diagnosis not present

## 2016-05-07 MED ORDER — OXYCODONE-ACETAMINOPHEN 5-325 MG PO TABS
2.0000 | ORAL_TABLET | Freq: Once | ORAL | Status: AC
  Administered 2016-05-07: 2 via ORAL
  Filled 2016-05-07: qty 2

## 2016-05-07 MED ORDER — OXYCODONE-ACETAMINOPHEN 5-325 MG PO TABS: 1.0000 | ORAL_TABLET | Freq: Four times a day (QID) | ORAL | 0 refills | Status: DC | PRN
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
## 2016-05-07 NOTE — ED Notes (Signed)
Discharge instructions reviewed with patient. Patient verbalized understanding. Patient taken to lobby via wheelchair by significant other.  
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
## 2016-07-16 ENCOUNTER — Ambulatory Visit: Payer: Self-pay | Admitting: Family Medicine

## 2016-07-18 ENCOUNTER — Ambulatory Visit (INDEPENDENT_AMBULATORY_CARE_PROVIDER_SITE_OTHER): Payer: Medicaid Other | Admitting: Family Medicine

## 2016-07-18 ENCOUNTER — Encounter: Payer: Self-pay | Admitting: Family Medicine

## 2016-07-18 VITALS — BP 126/82 | HR 87 | Temp 98.8°F | Ht 63.0 in | Wt 162.0 lb

## 2016-07-18 DIAGNOSIS — F411 Generalized anxiety disorder: Secondary | ICD-10-CM | POA: Diagnosis not present

## 2016-07-18 DIAGNOSIS — M25561 Pain in right knee: Secondary | ICD-10-CM | POA: Diagnosis not present

## 2016-07-18 DIAGNOSIS — M25562 Pain in left knee: Secondary | ICD-10-CM | POA: Diagnosis not present

## 2016-07-18 DIAGNOSIS — F331 Major depressive disorder, recurrent, moderate: Secondary | ICD-10-CM | POA: Diagnosis not present

## 2016-07-18 NOTE — Progress Notes (Signed)
BP 126/82   Pulse 87   Temp 98.8 F (37.1 C)   Ht 5\' 3"  (1.6 m)   Wt 162 lb (73.5 kg)   LMP 07/07/2016 (Approximate)   SpO2 100%   BMI 28.70 kg/m    Subjective:    Patient ID: Toni Friedman, female    DOB: 07/15/1981, 35 y.o.   MRN: [id number]  HPI: Toni Friedman is a 35 y.o. female  Chief Complaint  Patient presents with  . Establish Care  . Arthritis    patient states she has a lot of pain in her knees and left wrist   Patient presents to establish care today. Has ongoing issues with multiple joint pain, states this has been since about 35 years old. Also battles concurrent anxiety and depression. Has been out of work for quite some time now due to the pain, trying to get disability currently. Has had numerous fractures of wrists and legs, with hardware in multiple extremities. [facility], but has not been in a long time. Wanting to see what we can do for her pain and mood issues.   Patient's previous PCP started her on cymbalta back in June, but pt quickly dc'ed it due to nausea. States she hasn't tried any other mood medicines.   Per chart review, she has had multiple DUIs and a hx of alcohol abuse. Declines PT or chiropractic care, states she has failed all OTC remedies.   Did have a positive ANA back in 2016 and mother has lupus, no further work-up to this end per patient.    Past Medical History:  Diagnosis Date  . Anxiety   . Arthritis    knees  . Depression   . History of alcohol abuse    Social History   Social History  . Marital status: Single    Spouse name: N/A  . Number of children: N/A  . Years of education: N/A   Occupational History  . Not on file.   Social History Main Topics  . Smoking status: Current Every Day Smoker    Packs/day: 0.50  . Smokeless tobacco: Never Used  . Alcohol use No     Comment: socially  . Drug use: No  . Sexual activity: Not on file   Other Topics Concern  . Not on file    Social History Narrative  . No narrative on file    Relevant past medical, surgical, family and social history reviewed and updated as indicated. Interim medical history since our last visit reviewed. Allergies and medications reviewed and updated.  Review of Systems  Constitutional: Positive for fatigue.  HENT: Negative.   Eyes: Negative.   Respiratory: Negative.   Cardiovascular: Negative.   Gastrointestinal: Negative.   Genitourinary: Negative.   Musculoskeletal: Positive for arthralgias, back pain and joint swelling.  Skin: Negative.   Neurological: Negative.   Psychiatric/Behavioral: Positive for dysphoric mood.    Per HPI unless specifically indicated above     Objective:    BP 126/82   Pulse 87   Temp 98.8 F (37.1 C)   Ht 5\' 3"  (1.6 m)   Wt 162 lb (73.5 kg)   LMP 07/07/2016 (Approximate)   SpO2 100%   BMI 28.70 kg/m   Wt Readings from Last 3 Encounters:  07/18/16 162 lb (73.5 kg)  05/07/16 154 lb (69.9 kg)  12/19/15 166 lb 4.8 oz (75.4 kg)    Physical Exam  Constitutional: She is oriented to person,  place, and time. She appears well-developed and well-nourished. No distress.  HENT:  Head: Atraumatic.  Eyes: Conjunctivae are normal. No scleral icterus.  Neck: Normal range of motion. Neck supple.  Cardiovascular: Normal rate.   Pulmonary/Chest: Effort normal. No respiratory distress.  Musculoskeletal: Normal range of motion.  Lymphadenopathy:    She has no cervical adenopathy.  Neurological: She is alert and oriented to person, place, and time.  Skin: Skin is warm and dry.  Psychiatric: She has a normal mood and affect. Her behavior is normal.  Nursing note and vitals reviewed.     Assessment & Plan:   Problem List Items Addressed This Visit    None    Visit Diagnoses    Arthralgia of both knees    -  Primary   Relevant Orders   Ambulatory referral to Rheumatology   Ambulatory referral to Pain Clinic   Generalized anxiety disorder        Moderate episode of recurrent major depressive disorder (HCC)       Relevant Medications   busPIRone (BUSPAR) 7.5 MG tablet    Will refer to rheumatology for further eval. Will also get pain management referral going as patient is adamant that she needs daily pain medicines to help her get through each day. Long discussion about not prescribing these medicines here at the practice. Patient aware and understands. Will prescribe 5 tramadol, but discussed that that's all she can get from this clinic.   Discussed that I recommend the cymbalta as we can target both her moods as well as her chronic pain, but pt unwilling d/t nausea side effects. Offered prozac or zoloft, but pt remembers that she has previously tried both and has not tolerated them. Pt agreeable to trying low dose buspar to see how that does for her. Will start with 7.5 mg and follow up with her in 4-6 weeks to see how she is doing on it.   Follow up plan: Return in about 4 weeks (around 08/15/2016) for Depression/Anxiety f/u.

## 2016-07-19 MED ORDER — TRAMADOL HCL 50 MG PO TABS: 50.0000 mg | ORAL_TABLET | Freq: Every day | ORAL | 0 refills | Status: DC | PRN

## 2016-07-19 MED ORDER — BUSPIRONE HCL 7.5 MG PO TABS: 7.5000 mg | ORAL_TABLET | Freq: Two times a day (BID) | ORAL | 1 refills | Status: DC

## 2016-10-01 ENCOUNTER — Ambulatory Visit
Admission: RE | Admit: 2016-10-01 | Discharge: 2016-10-01 | Disposition: A | Discharge status: Home / Self Care | Payer: Disability Insurance | Source: Ambulatory Visit | Attending: Physical Medicine and Rehabilitation | Admitting: Physical Medicine and Rehabilitation

## 2016-10-01 ENCOUNTER — Other Ambulatory Visit: Payer: Self-pay | Admitting: Physical Medicine and Rehabilitation

## 2016-10-01 DIAGNOSIS — M25562 Pain in left knee: Secondary | ICD-10-CM | POA: Diagnosis present

## 2016-10-01 DIAGNOSIS — M25569 Pain in unspecified knee: Secondary | ICD-10-CM

## 2016-10-01 DIAGNOSIS — M25561 Pain in right knee: Secondary | ICD-10-CM | POA: Insufficient documentation

## 2016-10-16 ENCOUNTER — Emergency Department
Admission: EM | Admit: 2016-10-16 | Discharge: 2016-10-16 | Discharge status: Against Medical Advice | Payer: Medicaid Other | Attending: Emergency Medicine | Admitting: Emergency Medicine

## 2016-10-16 ENCOUNTER — Encounter: Payer: Self-pay | Admitting: Emergency Medicine

## 2016-10-16 DIAGNOSIS — Z87891 Personal history of nicotine dependence: Secondary | ICD-10-CM | POA: Diagnosis not present

## 2016-10-16 DIAGNOSIS — R51 Headache: Secondary | ICD-10-CM | POA: Diagnosis not present

## 2016-10-16 DIAGNOSIS — M542 Cervicalgia: Secondary | ICD-10-CM | POA: Diagnosis not present

## 2016-10-16 DIAGNOSIS — R112 Nausea with vomiting, unspecified: Secondary | ICD-10-CM | POA: Diagnosis not present

## 2016-10-16 DIAGNOSIS — R05 Cough: Secondary | ICD-10-CM | POA: Diagnosis not present

## 2016-10-16 DIAGNOSIS — M791 Myalgia, unspecified site: Secondary | ICD-10-CM

## 2016-10-16 LAB — URINALYSIS, COMPLETE (UACMP) WITH MICROSCOPIC
BACTERIA UA: NONE SEEN
BILIRUBIN URINE: NEGATIVE
Glucose, UA: NEGATIVE mg/dL
HGB URINE DIPSTICK: NEGATIVE
Ketones, ur: NEGATIVE mg/dL
Leukocytes, UA: NEGATIVE
NITRITE: NEGATIVE
PROTEIN: NEGATIVE mg/dL
Specific Gravity, Urine: 1.015 (ref 1.005–1.030)
pH: 5 (ref 5.0–8.0)

## 2016-10-16 LAB — CBC
HEMATOCRIT: 35.8 % (ref 35.0–47.0)
HEMOGLOBIN: 12.5 g/dL (ref 12.0–16.0)
MCH: 33.4 pg (ref 26.0–34.0)
MCHC: 34.9 g/dL (ref 32.0–36.0)
MCV: 95.6 fL (ref 80.0–100.0)
Platelets: 386 10*3/uL (ref 150–440)
RBC: 3.74 MIL/uL — AB (ref 3.80–5.20)
RDW: 12.9 % (ref 11.5–14.5)
WBC: 15.6 10*3/uL — AB (ref 3.6–11.0)

## 2016-10-16 LAB — COMPREHENSIVE METABOLIC PANEL
ALT: 20 U/L (ref 14–54)
AST: 21 U/L (ref 15–41)
Albumin: 4 g/dL (ref 3.5–5.0)
Alkaline Phosphatase: 24 U/L — ABNORMAL LOW (ref 38–126)
Anion gap: 6 (ref 5–15)
BUN: 10 mg/dL (ref 6–20)
CHLORIDE: 105 mmol/L (ref 101–111)
CO2: 26 mmol/L (ref 22–32)
CREATININE: 0.88 mg/dL (ref 0.44–1.00)
Calcium: 8.6 mg/dL — ABNORMAL LOW (ref 8.9–10.3)
GFR calc non Af Amer: >60 mL/min (ref >60)
GLUCOSE: 118 mg/dL — AB (ref 65–99)
Potassium: 3.8 mmol/L (ref 3.5–5.1)
SODIUM: 137 mmol/L (ref 135–145)
Total Bilirubin: 0.4 mg/dL (ref 0.3–1.2)
Total Protein: 6.6 g/dL (ref 6.5–8.1)

## 2016-10-16 LAB — POCT PREGNANCY, URINE: PREG TEST UR: NEGATIVE

## 2016-10-16 LAB — LIPASE, BLOOD: LIPASE: 24 U/L (ref 11–51)

## 2016-10-16 MED ORDER — ACETAMINOPHEN 325 MG PO TABS
650.0000 mg | ORAL_TABLET | Freq: Once | ORAL | Status: AC
  Administered 2016-10-16: 650 mg via ORAL
  Filled 2016-10-16: qty 2

## 2016-10-16 MED ORDER — KETOROLAC TROMETHAMINE 30 MG/ML IJ SOLN
30.0000 mg | Freq: Once | INTRAMUSCULAR | Status: DC
  Filled 2016-10-16: qty 1

## 2016-10-16 MED ORDER — ONDANSETRON HCL 4 MG/2ML IJ SOLN
4.0000 mg | Freq: Once | INTRAMUSCULAR | Status: DC
  Filled 2016-10-16: qty 2

## 2016-10-16 MED ORDER — SODIUM CHLORIDE 0.9 % IV BOLUS (SEPSIS): 1000.0000 mL | Freq: Once | INTRAVENOUS | Status: DC

## 2016-10-16 NOTE — ED Triage Notes (Signed)
Pt to triage via w/c with no distress noted, mask in place; pt reports abd pain tonight accomp by N/V that began after eating seafood tonight

## 2016-10-16 NOTE — ED Provider Notes (Signed)
[facility] Emergency Department Provider Note   ____________________________________________   First MD Initiated Contact with Patient 10/16/16 [phone]     (approximate)  I have reviewed the triage vital signs and the nursing notes.   HISTORY  Chief Complaint Abdominal Pain    HPI Toni Friedman is a 36 y.o. female who comes into the hospital today she reports with pain all over. She reports that she is an immense pain and she is sick. The patient ate some seafood earlier tonight according to her significant other and she developed this pain and vomiting afterwards. She reports that she has pain in her back and her abdomen, her legs her neck and her head. The patient reports that she has hot and cold flashes as well as chills. The patient did not take anything for pain at home and according to the family she could not keep anything down. The patient had a slight temperature but could not tell me what that was. She reports that she can't even get up because she is in so much pain. The patient denies pain with urination and denies any sick contacts. She reports that when she could not get better at home she came in for evaluation. The patient rates her pain a 9 out of 10 in intensity.   Past Medical History:  Diagnosis Date  . Anxiety   . Arthritis    knees  . Depression   . History of alcohol abuse     Patient Active Problem List   Diagnosis Date Noted  . Right tibial fracture 12/20/2015  . Closed right tibial fracture 12/19/2015    Past Surgical History:  Procedure Laterality Date  . KNEE SURGERY Left 2000  . TIBIA IM NAIL INSERTION Right 12/19/2015   Procedure: INTRAMEDULLARY (IM) NAIL TIBIAL;  Surgeon: Kennedy Bucker, MD;  Location: [facility] ORS;  Service: Orthopedics;  Laterality: Right;  . TUBAL LIGATION    . WRIST SURGERY  2000   fracture    Prior to Admission medications   Medication Sig Start Date End Date Taking? Authorizing Provider    busPIRone (BUSPAR) 7.5 MG tablet Take 1 tablet (7.5 mg total) by mouth 2 (two) times daily. 07/19/16   Gabriel Cirri, NP  traMADol (ULTRAM) 50 MG tablet Take 1 tablet (50 mg total) by mouth daily as needed. 07/19/16   Gabriel Cirri, NP    Allergies Patient has no known allergies.  Family History  Problem Relation Age of Onset  . Lupus Mother   . Diabetes Father   . Cancer Neg Hx   . COPD Neg Hx   . Heart disease Neg Hx   . Hypertension Neg Hx   . Stroke Neg Hx     Social History Social History  Substance Use Topics  . Smoking status: Former Smoker    Packs/day: 0.50  . Smokeless tobacco: Never Used  . Alcohol use No     Comment: socially    Review of Systems Constitutional: chills Eyes: No visual changes. ENT: No sore throat. Cardiovascular: Denies chest pain. Respiratory: Cough Gastrointestinal: Nausea and vomiting, abdominal pain. No diarrhea.  No constipation. Genitourinary: Negative for dysuria. Musculoskeletal: body aches  Skin: Negative for rash. Neurological: Headache  10-point ROS otherwise negative.  ____________________________________________   PHYSICAL EXAM:  VITAL SIGNS: ED Triage Vitals  Enc Vitals Group     BP 10/16/16 0040 128/78     Pulse Rate 10/16/16 0040 62     Resp 10/16/16 0040 18  Temp 10/16/16 0040 97.9 F (36.6 C)     Temp Source 10/16/16 0040 Oral     SpO2 10/16/16 0040 100 %     Weight 10/16/16 0040 160 lb (72.6 kg)     Height 10/16/16 0040 5\' 2"  (1.575 m)     Head Circumference --      Peak Flow --      Pain Score 10/16/16 0054 9     Pain Loc --      Pain Edu? --      Excl. in GC? --     Constitutional: Alert and oriented. Well appearing and in Moderate distress. Eyes: Conjunctivae are normal. PERRL. EOMI. Head: Atraumatic. Nose: No congestion/rhinnorhea. Mouth/Throat: Mucous membranes are moist.  Oropharynx non-erythematous. Cardiovascular: Normal rate, regular rhythm. Grossly normal heart sounds.  Good peripheral  circulation. Respiratory: Normal respiratory effort.  No retractions. Lungs CTAB. Gastrointestinal: Soft and nontender. No distention. Positive bowel sounds Musculoskeletal: Diffuse pain throughout her entire body Neurologic:  Normal speech and language.  Skin:  Skin is warm, dry and intact.  Psychiatric: Mood and affect are normal.   ____________________________________________   LABS (all labs ordered are listed, but only abnormal results are displayed)  Labs Reviewed  CBC - Abnormal; Notable for the following:       Result Value   WBC 15.6 (*)    RBC 3.74 (*)    All other components within normal limits  COMPREHENSIVE METABOLIC PANEL - Abnormal; Notable for the following:    Glucose, Bld 118 (*)    Calcium 8.6 (*)    Alkaline Phosphatase 24 (*)    All other components within normal limits  URINALYSIS, COMPLETE (UACMP) WITH MICROSCOPIC - Abnormal; Notable for the following:    Color, Urine YELLOW (*)    APPearance CLEAR (*)    Squamous Epithelial / LPF 0-5 (*)    All other components within normal limits  LIPASE, BLOOD  INFLUENZA PANEL BY PCR (TYPE A & B)  POCT PREGNANCY, URINE   ____________________________________________  EKG  none ____________________________________________  RADIOLOGY  none ____________________________________________   PROCEDURES  Procedure(s) performed: None  Procedures  Critical Care performed: No  ____________________________________________   INITIAL IMPRESSION / ASSESSMENT AND PLAN / ED COURSE  Pertinent labs & imaging results that were available during my care of the patient were reviewed by me and considered in my medical decision making (see chart for details).  This is a 36 year old female who comes into the hospital today she reports with diffuse body pain and vomiting. The patient reports that started this evening. Her spouse is concerned that it might have started after she ate food and thinks she may be food  poisoning. Given the patient's body aches I feel she may have flulike symptoms and I will check the flu swab. I did order for the patient to receive a liter of normal saline, Toradol and a flu swab. The patient's white blood cell count was 15.6 and her blood work otherwise was unremarkable. When the nurse to go into the room to treat the patient she refused the medication, the IV and the flu swab. The patient reported that she wanted pills and she just wanted to go home. I did offer the patient ibuprofen but the patient reports that she wanted Percocet as she been having some chronic knee pain and her doctor no longer prescribe her medications. I told the patient I would not give her Percocet and the patient decided she wanted to leave. The patient  left the emergency department AGAINST MEDICAL ADVICE.      ____________________________________________   FINAL CLINICAL IMPRESSION(S) / ED DIAGNOSES  Final diagnoses:  Myalgia  Nausea and vomiting, intractability of vomiting not specified, unspecified vomiting type      NEW MEDICATIONS STARTED DURING THIS VISIT:  Discharge Medication List as of 10/16/2016  4:44 AM       Note:  This document was prepared using Dragon voice recognition software and may include unintentional dictation errors.    Rebecka Apley, MD 10/16/16 [phone]

## 2016-10-16 NOTE — ED Notes (Addendum)
Pt refused IV access, IV fluid, Toradol, Zofran and flu swab. States she is here for her chronic back pain that worsen over last few days. States she ran out of pain meds/percocet and her PCP does not want to prescribe it anymore. States she is looking for another PCP, in the mean time she want percocet. Dr. Zenda AlpersWebster made aware. Pt offered Ibuprofen instead but refused. Pt Leaving AMA and refused to sign out.

## 2016-10-29 ENCOUNTER — Other Ambulatory Visit: Payer: Self-pay | Admitting: Unknown Physician Specialty

## 2016-10-30 ENCOUNTER — Telehealth: Payer: Self-pay | Admitting: Family Medicine

## 2016-10-30 MED ORDER — BUSPIRONE HCL 7.5 MG PO TABS: 7.5000 mg | ORAL_TABLET | Freq: Two times a day (BID) | ORAL | 0 refills | Status: DC

## 2016-10-30 NOTE — Telephone Encounter (Signed)
Routing to provider  

## 2016-10-30 NOTE — Telephone Encounter (Signed)
30 day supply sent

## 2016-11-01 ENCOUNTER — Ambulatory Visit: Payer: Medicaid Other | Admitting: Family Medicine

## 2019-02-20 ENCOUNTER — Emergency Department: Payer: Medicaid Other

## 2019-02-20 ENCOUNTER — Inpatient Hospital Stay
Admission: EM | Admit: 2019-02-20 | Discharge: 2019-02-24 | DRG: 917 | Disposition: A | Discharge status: Other Acute Inpatient Hospital | Payer: Medicaid Other | Attending: Internal Medicine | Admitting: Internal Medicine

## 2019-02-20 ENCOUNTER — Other Ambulatory Visit: Payer: Self-pay

## 2019-02-20 DIAGNOSIS — Z87891 Personal history of nicotine dependence: Secondary | ICD-10-CM

## 2019-02-20 DIAGNOSIS — F1220 Cannabis dependence, uncomplicated: Secondary | ICD-10-CM | POA: Diagnosis present

## 2019-02-20 DIAGNOSIS — F1010 Alcohol abuse, uncomplicated: Secondary | ICD-10-CM | POA: Diagnosis present

## 2019-02-20 DIAGNOSIS — T481X1A Poisoning by skeletal muscle relaxants [neuromuscular blocking agents], accidental (unintentional), initial encounter: Principal | ICD-10-CM | POA: Diagnosis present

## 2019-02-20 DIAGNOSIS — Z79899 Other long term (current) drug therapy: Secondary | ICD-10-CM

## 2019-02-20 DIAGNOSIS — Z818 Family history of other mental and behavioral disorders: Secondary | ICD-10-CM

## 2019-02-20 DIAGNOSIS — I959 Hypotension, unspecified: Secondary | ICD-10-CM | POA: Diagnosis not present

## 2019-02-20 DIAGNOSIS — G92 Toxic encephalopathy: Secondary | ICD-10-CM | POA: Diagnosis present

## 2019-02-20 DIAGNOSIS — F419 Anxiety disorder, unspecified: Secondary | ICD-10-CM | POA: Diagnosis present

## 2019-02-20 DIAGNOSIS — F332 Major depressive disorder, recurrent severe without psychotic features: Secondary | ICD-10-CM | POA: Diagnosis present

## 2019-02-20 DIAGNOSIS — T50901A Poisoning by unspecified drugs, medicaments and biological substances, accidental (unintentional), initial encounter: Secondary | ICD-10-CM | POA: Diagnosis present

## 2019-02-20 DIAGNOSIS — Z0189 Encounter for other specified special examinations: Secondary | ICD-10-CM

## 2019-02-20 DIAGNOSIS — R571 Hypovolemic shock: Secondary | ICD-10-CM | POA: Diagnosis present

## 2019-02-20 DIAGNOSIS — F411 Generalized anxiety disorder: Secondary | ICD-10-CM | POA: Diagnosis not present

## 2019-02-20 DIAGNOSIS — T50904A Poisoning by unspecified drugs, medicaments and biological substances, undetermined, initial encounter: Secondary | ICD-10-CM

## 2019-02-20 DIAGNOSIS — J9601 Acute respiratory failure with hypoxia: Secondary | ICD-10-CM | POA: Diagnosis present

## 2019-02-20 DIAGNOSIS — J9690 Respiratory failure, unspecified, unspecified whether with hypoxia or hypercapnia: Secondary | ICD-10-CM

## 2019-02-20 DIAGNOSIS — Z20828 Contact with and (suspected) exposure to other viral communicable diseases: Secondary | ICD-10-CM | POA: Diagnosis present

## 2019-02-20 DIAGNOSIS — G4709 Other insomnia: Secondary | ICD-10-CM | POA: Diagnosis not present

## 2019-02-20 DIAGNOSIS — Z23 Encounter for immunization: Secondary | ICD-10-CM

## 2019-02-20 DIAGNOSIS — E876 Hypokalemia: Secondary | ICD-10-CM | POA: Diagnosis present

## 2019-02-20 DIAGNOSIS — Z915 Personal history of self-harm: Secondary | ICD-10-CM

## 2019-02-20 DIAGNOSIS — T50902A Poisoning by unspecified drugs, medicaments and biological substances, intentional self-harm, initial encounter: Secondary | ICD-10-CM | POA: Diagnosis not present

## 2019-02-20 LAB — URINALYSIS, COMPLETE (UACMP) WITH MICROSCOPIC
Bacteria, UA: NONE SEEN
Bilirubin Urine: NEGATIVE
Glucose, UA: NEGATIVE mg/dL
Hgb urine dipstick: NEGATIVE
Ketones, ur: 5 mg/dL — AB
Leukocytes,Ua: NEGATIVE
Nitrite: NEGATIVE
Protein, ur: NEGATIVE mg/dL
Specific Gravity, Urine: 1.005 (ref 1.005–1.030)
pH: 6 (ref 5.0–8.0)

## 2019-02-20 LAB — BLOOD GAS, ARTERIAL
Acid-base deficit: 1.1 mmol/L (ref 0.0–2.0)
Bicarbonate: 22.6 mmol/L (ref 20.0–28.0)
FIO2: 0.3
MECHVT: 470 mL
Mechanical Rate: 14
O2 Saturation: 99.6 %
PEEP: 5 cmH2O
Patient temperature: 37
pCO2 arterial: 34 mmHg (ref 32.0–48.0)
pH, Arterial: 7.43 (ref 7.350–7.450)
pO2, Arterial: 175 mmHg — ABNORMAL HIGH (ref 83.0–108.0)

## 2019-02-20 LAB — COMPREHENSIVE METABOLIC PANEL
ALT: 20 U/L (ref 0–44)
AST: 20 U/L (ref 15–41)
Albumin: 4.2 g/dL (ref 3.5–5.0)
Alkaline Phosphatase: 18 U/L — ABNORMAL LOW (ref 38–126)
Anion gap: 9 (ref 5–15)
BUN: 11 mg/dL (ref 6–20)
CO2: 24 mmol/L (ref 22–32)
Calcium: 8.6 mg/dL — ABNORMAL LOW (ref 8.9–10.3)
Chloride: 104 mmol/L (ref 98–111)
Creatinine, Ser: 0.87 mg/dL (ref 0.44–1.00)
GFR calc Af Amer: >60 mL/min (ref >60)
GFR calc non Af Amer: >60 mL/min (ref >60)
Glucose, Bld: 84 mg/dL (ref 70–99)
Potassium: 3.4 mmol/L — ABNORMAL LOW (ref 3.5–5.1)
Sodium: 137 mmol/L (ref 135–145)
Total Bilirubin: 0.9 mg/dL (ref 0.3–1.2)
Total Protein: 7.8 g/dL (ref 6.5–8.1)

## 2019-02-20 LAB — CBC
HCT: 35 % — ABNORMAL LOW (ref 36.0–46.0)
Hemoglobin: 11.6 g/dL — ABNORMAL LOW (ref 12.0–15.0)
MCH: 30.1 pg (ref 26.0–34.0)
MCHC: 33.1 g/dL (ref 30.0–36.0)
MCV: 90.9 fL (ref 80.0–100.0)
Platelets: 380 10*3/uL (ref 150–400)
RBC: 3.85 MIL/uL — ABNORMAL LOW (ref 3.87–5.11)
RDW: 13.4 % (ref 11.5–15.5)
WBC: 9.6 10*3/uL (ref 4.0–10.5)
nRBC: 0 % (ref 0.0–0.2)

## 2019-02-20 LAB — URINE DRUG SCREEN, QUALITATIVE (ARMC ONLY)
Amphetamines, Ur Screen: NOT DETECTED
Barbiturates, Ur Screen: NOT DETECTED
Benzodiazepine, Ur Scrn: NOT DETECTED
Cannabinoid 50 Ng, Ur ~~LOC~~: POSITIVE — AB
Cocaine Metabolite,Ur ~~LOC~~: NOT DETECTED
MDMA (Ecstasy)Ur Screen: NOT DETECTED
Methadone Scn, Ur: NOT DETECTED
Opiate, Ur Screen: NOT DETECTED
Phencyclidine (PCP) Ur S: NOT DETECTED
Tricyclic, Ur Screen: POSITIVE — AB

## 2019-02-20 LAB — CBC WITH DIFFERENTIAL/PLATELET
Abs Immature Granulocytes: 0.06 10*3/uL (ref 0.00–0.07)
Basophils Absolute: 0.1 10*3/uL (ref 0.0–0.1)
Basophils Relative: 1 %
Eosinophils Absolute: 0.2 10*3/uL (ref 0.0–0.5)
Eosinophils Relative: 2 %
HCT: 34.9 % — ABNORMAL LOW (ref 36.0–46.0)
Hemoglobin: 11.6 g/dL — ABNORMAL LOW (ref 12.0–15.0)
Immature Granulocytes: 1 %
Lymphocytes Relative: 27 %
Lymphs Abs: 2.7 10*3/uL (ref 0.7–4.0)
MCH: 30.1 pg (ref 26.0–34.0)
MCHC: 33.2 g/dL (ref 30.0–36.0)
MCV: 90.6 fL (ref 80.0–100.0)
Monocytes Absolute: 0.7 10*3/uL (ref 0.1–1.0)
Monocytes Relative: 7 %
Neutro Abs: 6.2 10*3/uL (ref 1.7–7.7)
Neutrophils Relative %: 62 %
Platelets: 390 10*3/uL (ref 150–400)
RBC: 3.85 MIL/uL — ABNORMAL LOW (ref 3.87–5.11)
RDW: 13.4 % (ref 11.5–15.5)
WBC: 9.9 10*3/uL (ref 4.0–10.5)
nRBC: 0 % (ref 0.0–0.2)

## 2019-02-20 LAB — ETHANOL: Alcohol, Ethyl (B): <10 mg/dL (ref <10)

## 2019-02-20 LAB — GLUCOSE, CAPILLARY: Glucose-Capillary: 80 mg/dL (ref 70–99)

## 2019-02-20 LAB — SARS CORONAVIRUS 2 BY RT PCR (HOSPITAL ORDER, PERFORMED IN ~~LOC~~ HOSPITAL LAB): SARS Coronavirus 2: NEGATIVE

## 2019-02-20 LAB — PREGNANCY, URINE: Preg Test, Ur: NEGATIVE

## 2019-02-20 MED ORDER — CHLORHEXIDINE GLUCONATE CLOTH 2 % EX PADS
6.0000 | MEDICATED_PAD | Freq: Every day | CUTANEOUS | Status: DC
  Administered 2019-02-21: 6 via TOPICAL

## 2019-02-20 MED ORDER — THIAMINE HCL 100 MG/ML IJ SOLN
100.0000 mg | Freq: Every day | INTRAMUSCULAR | Status: DC
  Filled 2019-02-20: qty 2

## 2019-02-20 MED ORDER — FOLIC ACID 5 MG/ML IJ SOLN
1.0000 mg | Freq: Every day | INTRAMUSCULAR | Status: DC
  Filled 2019-02-20: qty 0.2

## 2019-02-20 MED ORDER — PROPOFOL 1000 MG/100ML IV EMUL
INTRAVENOUS | Status: AC
  Administered 2019-02-20: 25 ug/kg/min via INTRAVENOUS
  Filled 2019-02-20: qty 100

## 2019-02-20 MED ORDER — PROPOFOL 1000 MG/100ML IV EMUL
5.0000 ug/kg/min | INTRAVENOUS | Status: DC
  Administered 2019-02-20: 10 ug/kg/min via INTRAVENOUS

## 2019-02-20 MED ORDER — SUCCINYLCHOLINE CHLORIDE 20 MG/ML IJ SOLN
100.0000 mg | Freq: Once | INTRAMUSCULAR | Status: AC
Start: 2019-02-20 — End: 2019-02-20
  Administered 2019-02-20: 100 mg via INTRAVENOUS

## 2019-02-20 MED ORDER — PANTOPRAZOLE SODIUM 40 MG IV SOLR
40.0000 mg | INTRAVENOUS | Status: DC
  Administered 2019-02-21: 40 mg via INTRAVENOUS
  Filled 2019-02-20: qty 40

## 2019-02-20 MED ORDER — ENOXAPARIN SODIUM 40 MG/0.4ML ~~LOC~~ SOLN
40.0000 mg | SUBCUTANEOUS | Status: DC
  Administered 2019-02-20: 40 mg via SUBCUTANEOUS
  Filled 2019-02-20: qty 0.4

## 2019-02-20 MED ORDER — SODIUM CHLORIDE 0.9 % IV SOLN
INTRAVENOUS | Status: DC
  Administered 2019-02-20 – 2019-02-22 (×5): via INTRAVENOUS

## 2019-02-20 MED ORDER — ONDANSETRON HCL 4 MG PO TABS: 4.0000 mg | ORAL_TABLET | Freq: Four times a day (QID) | ORAL | Status: DC | PRN

## 2019-02-20 MED ORDER — NALOXONE HCL 2 MG/2ML IJ SOSY
PREFILLED_SYRINGE | INTRAMUSCULAR | Status: AC
  Administered 2019-02-20: 20:00:00
  Filled 2019-02-20: qty 4

## 2019-02-20 MED ORDER — MIDAZOLAM HCL 5 MG/5ML IJ SOLN
2.0000 mg | Freq: Once | INTRAMUSCULAR | Status: AC
  Administered 2019-02-20: 2 mg via INTRAVENOUS

## 2019-02-20 MED ORDER — NALOXONE HCL 2 MG/2ML IJ SOSY
4.0000 mg | PREFILLED_SYRINGE | Freq: Once | INTRAMUSCULAR | Status: AC
Start: 2019-02-20 — End: 2019-02-20
  Administered 2019-02-20: 4 mg via INTRAVENOUS
  Filled 2019-02-20: qty 4

## 2019-02-20 MED ORDER — ONDANSETRON HCL 4 MG/2ML IJ SOLN: 4.0000 mg | Freq: Four times a day (QID) | INTRAMUSCULAR | Status: DC | PRN

## 2019-02-20 NOTE — ED Notes (Signed)
Pt with increased movement of arms and legs. Propofol increased.

## 2019-02-20 NOTE — ED Notes (Signed)
Pt returned from ct. Bear hugger on medium restarted.

## 2019-02-20 NOTE — ED Provider Notes (Addendum)
[facility] Emergency Department Provider Note   ____________________________________________   First MD Initiated Contact with Patient 02/20/19 1928     (approximate)  I have reviewed the triage vital signs and the nursing notes.   HISTORY  Chief Complaint Drug Overdose  History limited by patient unresponsiveness HPI Toni Friedman is a 38 y.o. female who was brought in by EMS for possible overdose of Flexeril and gabapentin.  Please give milligrams of intranasal Narcan.  Patient got a sternal rub strong enough to give her abrasion on her chest.  Patient with no gag reflex in the emergency room pupils are mid position.  Patient was snoring respirations needing chin lift to stop snoring.  O2 sats 100% on 6 L.  Respiratory rate initially was 4 with sats of 90%.         Past Medical History:  Diagnosis Date  . Anxiety   . Arthritis    knees  . Depression   . History of alcohol abuse     Patient Active Problem List   Diagnosis Date Noted  . Right tibial fracture 12/20/2015  . Closed right tibial fracture 12/19/2015    Past Surgical History:  Procedure Laterality Date  . KNEE SURGERY Left 2000  . TIBIA IM NAIL INSERTION Right 12/19/2015   Procedure: INTRAMEDULLARY (IM) NAIL TIBIAL;  Surgeon: Kennedy BuckerMichael Menz, MD;  Location: [facility] ORS;  Service: Orthopedics;  Laterality: Right;  . TUBAL LIGATION    . WRIST SURGERY  2000   fracture    Prior to Admission medications   Medication Sig Start Date End Date Taking? Authorizing Provider  cyclobenzaprine (FLEXERIL) 10 MG tablet Take 10 mg by mouth 3 (three) times daily as needed for muscle spasms.   Yes [provider]  DULoxetine (CYMBALTA) 60 MG capsule Take 60 mg by mouth daily. 06/12/18 06/12/19 Yes [provider]  gabapentin (NEURONTIN) 600 MG tablet Take 600 mg by mouth 3 (three) times daily. 06/12/18 06/12/19 Yes [provider]    Allergies Patient has no known  allergies.  Family History  Problem Relation Age of Onset  . Lupus Mother   . Diabetes Father   . Cancer Neg Hx   . COPD Neg Hx   . Heart disease Neg Hx   . Hypertension Neg Hx   . Stroke Neg Hx     Social History Social History   Tobacco Use  . Smoking status: Former Smoker    Packs/day: 0.50  . Smokeless tobacco: Never Used  Substance Use Topics  . Alcohol use: No    Comment: socially  . Drug use: No    Review of Systems Unobtainable ____________________________________________   PHYSICAL EXAM:  VITAL SIGNS: ED Triage Vitals  Enc Vitals Group     BP 02/20/19 1930 108/84     Pulse Rate 02/20/19 1929 (!) 112     Resp 02/20/19 1929 (!) 4     Temp --      Temp src --      SpO2 02/20/19 1929 90 %     Weight 02/20/19 1930 180 lb (81.6 kg)     Height --      Head Circumference --      Peak Flow --      Pain Score --      Pain Loc --      Pain Edu? --      Excl. in GC? --     Constitutional: Not alert not responsive Eyes:  Conjunctivae are normal.  Both midposition Head: Atraumatic. Nose: No congestion/rhinnorhea. Mouth/Throat: Mucous membranes are moist.  Oropharynx non-erythematous.  No gag reflex Neck: No stridor.  Does have snoring respirations cardiovascular: Normal rate, regular rhythm. Grossly normal heart sounds.  Good peripheral circulation. Respiratory: Normal respiratory effort.  No retractions. Lungs CTAB. Gastrointestinal: Soft and nontender. No distention. No abdominal bruits. No CVA tenderness. Musculoskeletal: No lower extremity tenderness nor edema.  . Neurologic:  Normal speech and language. No gross focal neurologic deficits are appreciated.  Skin:  Skin is warm, dry and intact. No rash noted.   ____________________________________________   LABS (all labs ordered are listed, but only abnormal results are displayed)  Labs Reviewed  CBC WITH DIFFERENTIAL/PLATELET - Abnormal; Notable for the following components:      Result Value    RBC 3.85 (*)    Hemoglobin 11.6 (*)    HCT 34.9 (*)    All other components within normal limits  COMPREHENSIVE METABOLIC PANEL - Abnormal; Notable for the following components:   Potassium 3.4 (*)    Calcium 8.6 (*)    Alkaline Phosphatase 18 (*)    All other components within normal limits  URINALYSIS, COMPLETE (UACMP) WITH MICROSCOPIC - Abnormal; Notable for the following components:   Color, Urine STRAW (*)    APPearance CLEAR (*)    Ketones, ur 5 (*)    All other components within normal limits  URINE DRUG SCREEN, QUALITATIVE ([facility] ONLY) - Abnormal; Notable for the following components:   Tricyclic, Ur Screen POSITIVE (*)    Cannabinoid 50 Ng, Ur  POSITIVE (*)    All other components within normal limits  BLOOD GAS, ARTERIAL - Abnormal; Notable for the following components:   pO2, Arterial 175 (*)    All other components within normal limits  SARS CORONAVIRUS 2 (HOSPITAL ORDER, [facility])  ETHANOL  PREGNANCY, URINE  COMPREHENSIVE METABOLIC PANEL  ACETAMINOPHEN LEVEL  TROPONIN I  SALICYLATE LEVEL   ____________________________________________  EKG EKG read interpreted by me shows sinus tach rate of 109 left axis acute RS duration is 111 ms nonspecific ST-T wave changes  ____________________________________________  RADIOLOGY  ED MD interpretation:  Official radiology report(s): Dg Chest 1 View  Result Date: 02/20/2019 CLINICAL DATA:  38 year old female status post intubation. Former smoker. EXAM: CHEST  1 VIEW COMPARISON:  No priors. FINDINGS: An endotracheal tube is in place with tip 5.6 cm above the carina. Nasogastric tube terminating in the distal esophagus. Lung volumes are low. No consolidative airspace disease. No pleural effusions. No pneumothorax. No pulmonary nodule or mass noted. Pulmonary vasculature and the cardiomediastinal silhouette are within normal limits. IMPRESSION: 1. Support apparatus, as above. Please consider  advancement of the nasogastric tube approximately 13 cm for more optimal placement in the stomach. 2. Low lung volumes without radiographic evidence of acute cardiopulmonary disease. Electronically Signed   By: Vinnie Langton M.D.   On: 02/20/2019 20:14    ____________________________________________   PROCEDURES  Procedure(s) performed (including Critical Care):  Procedures   ____________________________________________   INITIAL IMPRESSION / ASSESSMENT AND PLAN / ED COURSE  Discussed with poison control right now we will monitor her and do supportive management.    Repeat EKG in 4 hours and check the Tylenol and aspirin levels          ____________________________________________   FINAL CLINICAL IMPRESSION(S) / ED DIAGNOSES  Final diagnoses:  Drug overdose, undetermined intent, initial encounter     ED Discharge Orders  None       Note:  This document was prepared using Dragon voice recognition software and may include unintentional dictation errors.    Arnaldo NatalMalinda, Allyn Bartelson F, MD 02/20/19 2110    Arnaldo NatalMalinda, Tacari Repass F, MD 03/01/19 [phone]

## 2019-02-20 NOTE — ED Notes (Signed)
Bear hugger increased to high.

## 2019-02-20 NOTE — H&P (Signed)
[employer] - [place] at [facility]   PATIENT NAME: Toni Friedman    MR#:  [id number]  DATE OF BIRTH:  07/10/1981  DATE OF ADMISSION:  02/20/2019  PRIMARY CARE PHYSICIAN: Jerrilyn Cairo[place], [facility]   REQUESTING/REFERRING PHYSICIAN: Dorothea GlassmanPaul Malinda, MD  CHIEF COMPLAINT:   Chief Complaint  Patient presents with  . Drug Overdose    HISTORY OF PRESENT ILLNESS:  Toni Buttersngel Urquidi  is a 38 y.o. female with a known history of alcohol abuse, depression, anxiety, arthritis.  She was brought to the emergency room by EMS services for possible overdose of Flexeril, Cymbalta, Neurontin.  These are medications prescribed to the patient according to our records.  Urine drug screen was positive for try cyclic and cannabinoid on arrival.  Patient received deep sternal rub with no response.  She was noted to have no gag reflex in the emergency room with nonreactive pupils.  Patient was noted to have periods of apnea with continued unresponsiveness in the emergency room.  Therefore, she was intubated.  At the time that I am seeing the patient, she has become more aware and was "moving around in the bed "according to her nurse and attempting to extubate herself.  Therefore propofol infusion has been started.  Pupils now appear sluggishly reactive.  No edema seen.  There is a small abrasion on her anterior chest at the level of her sternum which appears to be related to deep sternal rub.  Chest x-ray shows no acute pulmonary disease.    Labs: Troponin is less than 0.03.  Potassium is 3.4.  Acetaminophen level is less than 10, salicylate level is less than 7.0.  Glucose is 90.  Rapid COVID testing is negative. she has been admitted by the hospitalist service.  She will be admitted to the ICU.  I have transferred care to Dr.Deterding via E-Link.   PAST MEDICAL HISTORY:   Past Medical History:  Diagnosis Date  . Anxiety   . Arthritis    knees  . Depression   . History of alcohol abuse      PAST SURGICAL HISTORY:   Past Surgical History:  Procedure Laterality Date  . KNEE SURGERY Left 2000  . TIBIA IM NAIL INSERTION Right 12/19/2015   Procedure: INTRAMEDULLARY (IM) NAIL TIBIAL;  Surgeon: Kennedy BuckerMichael Menz, MD;  Location: [facility] ORS;  Service: Orthopedics;  Laterality: Right;  . TUBAL LIGATION    . WRIST SURGERY  2000   fracture    SOCIAL HISTORY:   Social History   Tobacco Use  . Smoking status: Former Smoker    Packs/day: 0.50  . Smokeless tobacco: Never Used  Substance Use Topics  . Alcohol use: No    Comment: socially    FAMILY HISTORY:   Family History  Problem Relation Age of Onset  . Lupus Mother   . Diabetes Father   . Cancer Neg Hx   . COPD Neg Hx   . Heart disease Neg Hx   . Hypertension Neg Hx   . Stroke Neg Hx     DRUG ALLERGIES:  No Known Allergies  REVIEW OF SYSTEMS:   Review of Systems  Unable to perform ROS: Intubated      MEDICATIONS AT HOME:   Prior to Admission medications   Medication Sig Start Date End Date Taking? Authorizing Provider  cyclobenzaprine (FLEXERIL) 10 MG tablet Take 10 mg by mouth 3 (three) times daily as needed for muscle spasms.   Yes [provider]  DULoxetine (CYMBALTA)  60 MG capsule Take 60 mg by mouth daily. 06/12/18 06/12/19 Yes [provider]  gabapentin (NEURONTIN) 600 MG tablet Take 600 mg by mouth 3 (three) times daily. 06/12/18 06/12/19 Yes [provider]      VITAL SIGNS:  Blood pressure 113/76, pulse (!) 101, temperature (!) 95.4 F (35.2 C), resp. rate 14, weight 81.6 kg, SpO2 99 %.  PHYSICAL EXAMINATION:  Physical Exam Vitals signs and nursing note reviewed.  Constitutional:      Appearance: She is normal weight.  HENT:     Head: Normocephalic.     Right Ear: External ear normal.     Left Ear: External ear normal.     Nose: Nose normal.     Mouth/Throat:     Mouth: Mucous membranes are moist.     Pharynx: Oropharynx is clear.  Eyes:     General: No scleral  icterus.    Comments: Pupils sluggishly reactive to light bilaterally  Neck:     Musculoskeletal: Neck supple.  Cardiovascular:     Rate and Rhythm: Normal rate and regular rhythm.     Pulses: Normal pulses.     Heart sounds: Normal heart sounds. No murmur. No friction rub. No gallop.   Pulmonary:     Breath sounds: Normal breath sounds. No wheezing, rhonchi or rales.     Comments: Intubated Abdominal:     General: Bowel sounds are normal. There is no distension.     Palpations: Abdomen is soft.  Musculoskeletal:        General: No swelling.     Right lower leg: No edema.     Left lower leg: No edema.  Lymphadenopathy:     Cervical: No cervical adenopathy.  Skin:    General: Skin is warm and dry.     Capillary Refill: Capillary refill takes less than 2 seconds.     Findings: No bruising or rash.  Neurological:     Comments: Intubated and sedated     LABORATORY PANEL:   CBC Recent Labs  Lab 02/20/19 1939  WBC 9.9  HGB 11.6*  HCT 34.9*  PLT 390   ------------------------------------------------------------------------------------------------------------------  Chemistries  Recent Labs  Lab 02/20/19 1939  NA 137  K 3.4*  CL 104  CO2 24  GLUCOSE 84  BUN 11  CREATININE 0.87  CALCIUM 8.6*  AST 20  ALT 20  ALKPHOS 18*  BILITOT 0.9   ------------------------------------------------------------------------------------------------------------------  Cardiac Enzymes No results for input(s): TROPONINI in the last 168 hours. ------------------------------------------------------------------------------------------------------------------  RADIOLOGY:  Dg Chest 1 View  Result Date: 02/20/2019 CLINICAL DATA:  38 year old female status post intubation. Former smoker. EXAM: CHEST  1 VIEW COMPARISON:  No priors. FINDINGS: An endotracheal tube is in place with tip 5.6 cm above the carina. Nasogastric tube terminating in the distal esophagus. Lung volumes are low. No  consolidative airspace disease. No pleural effusions. No pneumothorax. No pulmonary nodule or mass noted. Pulmonary vasculature and the cardiomediastinal silhouette are within normal limits. IMPRESSION: 1. Support apparatus, as above. Please consider advancement of the nasogastric tube approximately 13 cm for more optimal placement in the stomach. 2. Low lung volumes without radiographic evidence of acute cardiopulmonary disease. Electronically Signed   By: Vinnie Langton M.D.   On: 02/20/2019 20:14   Ct Head Wo Contrast  Result Date: 02/20/2019 CLINICAL DATA:  Possible overdose EXAM: CT HEAD WITHOUT CONTRAST TECHNIQUE: Contiguous axial images were obtained from the base of the skull through the vertex without intravenous contrast. COMPARISON:  None. FINDINGS: Brain: No evidence of acute infarction, hemorrhage, hydrocephalus, extra-axial collection or mass lesion/mass effect. Vascular: No hyperdense vessel or unexpected calcification. Skull: Normal. Negative for fracture or focal lesion. Sinuses/Orbits: No acute finding. Other: None. IMPRESSION: No acute intracranial pathology. Electronically Signed   By: Lauralyn PrimesAlex  Bibbey M.D.   On: 02/20/2019 21:13      IMPRESSION AND PLAN:   1.  Drug overdose, intent undetermined, initial encounter -Patient was intubated shortly after arrival to the emergency room.  She is being admitted to the ICU for further management.  I have spoken with Dr.Deterding transfer of care. - Poison control contacted - We will continue every 4 hours BMP - Every 6 hour EKG - Normal saline infusing at 125 cc/h - Psychiatric consultation for evaluation has been added by the emergency room  --Suicide precautions  2.  History of EtOH abuse - CIWA protocol  3.  History of depression - Patient has been on Cymbalta according to documentation.  Medications are being held currently as patient is intubated.  DVT and PPI prophylaxis have been initiated.     All the records are  reviewed and case discussed with ED provider. The plan of care was discussed in details with the patient (and family). I answered all questions. The patient agreed to proceed with the above mentioned plan. Further management will depend upon hospital course.   CODE STATUS: Full code  TOTAL TIME TAKING CARE OF THIS PATIENT: 45 minutes.    Milas Kocherngela H Dominik Lauricella CRNP on 02/20/2019 at 10:38 PM  Pager - [phone]  After 6pm go to [email or web address] - Social research officer, governmentpassword EPAS [facility]  [facility] [place] [facility]  Office  [phone]  CC: Primary care physician; Jerrilyn [place], [facility]   Note: This dictation was prepared with Dragon dictation along with smaller phrase technology. Any transcriptional errors that result from this process are unintentional.

## 2019-02-20 NOTE — ED Notes (Signed)
Pt to ct with rn and rt

## 2019-02-20 NOTE — ED Notes (Signed)
Pt was covered with warm blankets after foley insertion.

## 2019-02-20 NOTE — ED Triage Notes (Signed)
Pt arrives via ems with possible overdose on flexeril and gabapentin. Pt is snoring. Police gave 2mg  intranasal narcan. Pt with abrasion noted to center chest from sternal rub. Pt at this time is nonresponsive to sternal rub. Pupil non reactive, pt with periods of apnea

## 2019-02-20 NOTE — ED Notes (Signed)
NP with hospitalist service at bedside.

## 2019-02-20 NOTE — ED Notes (Signed)
Patient returned from CT

## 2019-02-21 ENCOUNTER — Inpatient Hospital Stay: Payer: Medicaid Other

## 2019-02-21 ENCOUNTER — Other Ambulatory Visit: Payer: Self-pay

## 2019-02-21 DIAGNOSIS — F332 Major depressive disorder, recurrent severe without psychotic features: Secondary | ICD-10-CM

## 2019-02-21 DIAGNOSIS — T50902A Poisoning by unspecified drugs, medicaments and biological substances, intentional self-harm, initial encounter: Secondary | ICD-10-CM

## 2019-02-21 DIAGNOSIS — J9601 Acute respiratory failure with hypoxia: Secondary | ICD-10-CM

## 2019-02-21 LAB — COMPREHENSIVE METABOLIC PANEL
ALT: 18 U/L (ref 0–44)
AST: 19 U/L (ref 15–41)
Albumin: 3.8 g/dL (ref 3.5–5.0)
Alkaline Phosphatase: 20 U/L — ABNORMAL LOW (ref 38–126)
Anion gap: 8 (ref 5–15)
BUN: 10 mg/dL (ref 6–20)
CO2: 22 mmol/L (ref 22–32)
Calcium: 8.4 mg/dL — ABNORMAL LOW (ref 8.9–10.3)
Chloride: 109 mmol/L (ref 98–111)
Creatinine, Ser: 0.72 mg/dL (ref 0.44–1.00)
GFR calc Af Amer: >60 mL/min (ref >60)
GFR calc non Af Amer: >60 mL/min (ref >60)
Glucose, Bld: 90 mg/dL (ref 70–99)
Potassium: 3.4 mmol/L — ABNORMAL LOW (ref 3.5–5.1)
Sodium: 139 mmol/L (ref 135–145)
Total Bilirubin: 0.9 mg/dL (ref 0.3–1.2)
Total Protein: 7.5 g/dL (ref 6.5–8.1)

## 2019-02-21 LAB — BASIC METABOLIC PANEL
Anion gap: 5 (ref 5–15)
Anion gap: 5 (ref 5–15)
Anion gap: 6 (ref 5–15)
BUN: 10 mg/dL (ref 6–20)
BUN: 8 mg/dL (ref 6–20)
BUN: 6 mg/dL (ref 6–20)
CO2: 20 mmol/L — ABNORMAL LOW (ref 22–32)
CO2: 20 mmol/L — ABNORMAL LOW (ref 22–32)
CO2: 21 mmol/L — ABNORMAL LOW (ref 22–32)
Calcium: 7.1 mg/dL — ABNORMAL LOW (ref 8.9–10.3)
Calcium: 6.8 mg/dL — ABNORMAL LOW (ref 8.9–10.3)
Calcium: 7.3 mg/dL — ABNORMAL LOW (ref 8.9–10.3)
Chloride: 114 mmol/L — ABNORMAL HIGH (ref 98–111)
Chloride: 116 mmol/L — ABNORMAL HIGH (ref 98–111)
Chloride: 111 mmol/L (ref 98–111)
Creatinine, Ser: 0.79 mg/dL (ref 0.44–1.00)
Creatinine, Ser: 0.78 mg/dL (ref 0.44–1.00)
Creatinine, Ser: 0.72 mg/dL (ref 0.44–1.00)
GFR calc Af Amer: >60 mL/min (ref >60)
GFR calc Af Amer: >60 mL/min (ref >60)
GFR calc Af Amer: >60 mL/min (ref >60)
GFR calc non Af Amer: >60 mL/min (ref >60)
GFR calc non Af Amer: >60 mL/min (ref >60)
GFR calc non Af Amer: >60 mL/min (ref >60)
Glucose, Bld: 109 mg/dL — ABNORMAL HIGH (ref 70–99)
Glucose, Bld: 97 mg/dL (ref 70–99)
Glucose, Bld: 89 mg/dL (ref 70–99)
Potassium: 3.8 mmol/L (ref 3.5–5.1)
Potassium: 3.7 mmol/L (ref 3.5–5.1)
Potassium: 3.4 mmol/L — ABNORMAL LOW (ref 3.5–5.1)
Sodium: 139 mmol/L (ref 135–145)
Sodium: 141 mmol/L (ref 135–145)
Sodium: 138 mmol/L (ref 135–145)

## 2019-02-21 LAB — BLOOD GAS, ARTERIAL
Acid-base deficit: 3.9 mmol/L — ABNORMAL HIGH (ref 0.0–2.0)
Bicarbonate: 20.8 mmol/L (ref 20.0–28.0)
FIO2: 0.3
MECHVT: 470 mL
Mechanical Rate: 14
O2 Saturation: 99.1 %
PEEP: 5 cmH2O
Patient temperature: 37
pCO2 arterial: 36 mmHg (ref 32.0–48.0)
pH, Arterial: 7.37 (ref 7.350–7.450)
pO2, Arterial: 139 mmHg — ABNORMAL HIGH (ref 83.0–108.0)

## 2019-02-21 LAB — CBC
HCT: 29.5 % — ABNORMAL LOW (ref 36.0–46.0)
Hemoglobin: 9.5 g/dL — ABNORMAL LOW (ref 12.0–15.0)
MCH: 30 pg (ref 26.0–34.0)
MCHC: 32.2 g/dL (ref 30.0–36.0)
MCV: 93.1 fL (ref 80.0–100.0)
Platelets: 308 10*3/uL (ref 150–400)
RBC: 3.17 MIL/uL — ABNORMAL LOW (ref 3.87–5.11)
RDW: 13.4 % (ref 11.5–15.5)
WBC: 8.1 10*3/uL (ref 4.0–10.5)
nRBC: 0 % (ref 0.0–0.2)

## 2019-02-21 LAB — SALICYLATE LEVEL: Salicylate Lvl: <7 mg/dL (ref 2.8–30.0)

## 2019-02-21 LAB — TROPONIN I
Troponin I: <0.03 ng/mL (ref <0.03)
Troponin I: <0.03 ng/mL (ref <0.03)
Troponin I: <0.03 ng/mL (ref <0.03)

## 2019-02-21 LAB — PROTIME-INR
INR: 1.1 (ref 0.8–1.2)
Prothrombin Time: 14.5 seconds (ref 11.4–15.2)

## 2019-02-21 LAB — TSH: TSH: 4.939 u[IU]/mL — ABNORMAL HIGH (ref 0.350–4.500)

## 2019-02-21 LAB — PHOSPHORUS: Phosphorus: 3.2 mg/dL (ref 2.5–4.6)

## 2019-02-21 LAB — MAGNESIUM: Magnesium: 2 mg/dL (ref 1.7–2.4)

## 2019-02-21 LAB — GLUCOSE, CAPILLARY
Glucose-Capillary: 107 mg/dL — ABNORMAL HIGH (ref 70–99)
Glucose-Capillary: 85 mg/dL (ref 70–99)
Glucose-Capillary: 75 mg/dL (ref 70–99)

## 2019-02-21 LAB — MRSA PCR SCREENING: MRSA by PCR: NEGATIVE

## 2019-02-21 LAB — ACETAMINOPHEN LEVEL: Acetaminophen (Tylenol), Serum: <10 ug/mL — ABNORMAL LOW (ref 10–30)

## 2019-02-21 MED ORDER — NICOTINE 21 MG/24HR TD PT24: 21.0000 mg | MEDICATED_PATCH | Freq: Every day | TRANSDERMAL | Status: DC

## 2019-02-21 MED ORDER — ENOXAPARIN SODIUM 40 MG/0.4ML ~~LOC~~ SOLN
40.0000 mg | SUBCUTANEOUS | Status: DC
  Administered 2019-02-23: 40 mg via SUBCUTANEOUS
  Filled 2019-02-21 (×2): qty 0.4

## 2019-02-21 MED ORDER — PROPOFOL 1000 MG/100ML IV EMUL
5.0000 ug/kg/min | INTRAVENOUS | Status: AC
  Administered 2019-02-21: 20 ug/kg/min via INTRAVENOUS

## 2019-02-21 MED ORDER — SODIUM CHLORIDE 0.9 % IV SOLN
0.0000 ug/min | INTRAVENOUS | Status: DC
  Filled 2019-02-21: qty 1

## 2019-02-21 MED ORDER — DEXMEDETOMIDINE HCL IN NACL 400 MCG/100ML IV SOLN
0.0000 ug/kg/h | INTRAVENOUS | Status: DC
  Administered 2019-02-21: 1.2 ug/kg/h via INTRAVENOUS
  Administered 2019-02-21: 0.4 ug/kg/h via INTRAVENOUS
  Filled 2019-02-21 (×2): qty 100

## 2019-02-21 MED ORDER — SODIUM CHLORIDE 0.9 % IV BOLUS
1000.0000 mL | Freq: Once | INTRAVENOUS | Status: AC
  Administered 2019-02-21: 1000 mL via INTRAVENOUS

## 2019-02-21 MED ORDER — FENTANYL CITRATE (PF) 100 MCG/2ML IJ SOLN: 50.0000 ug | INTRAMUSCULAR | Status: DC | PRN

## 2019-02-21 MED ORDER — LORAZEPAM 1 MG PO TABS: 0.0000 mg | ORAL_TABLET | Freq: Two times a day (BID) | ORAL | Status: DC

## 2019-02-21 MED ORDER — NICOTINE 21 MG/24HR TD PT24
21.0000 mg | MEDICATED_PATCH | Freq: Every day | TRANSDERMAL | Status: DC
  Administered 2019-02-22 – 2019-02-24 (×4): 21 mg via TRANSDERMAL
  Filled 2019-02-21 (×5): qty 1

## 2019-02-21 MED ORDER — MIDAZOLAM HCL 2 MG/2ML IJ SOLN
2.0000 mg | INTRAMUSCULAR | Status: DC | PRN
  Administered 2019-02-21: 2 mg via INTRAVENOUS

## 2019-02-21 MED ORDER — BISACODYL 10 MG RE SUPP: 10.0000 mg | Freq: Every day | RECTAL | Status: DC | PRN

## 2019-02-21 MED ORDER — IPRATROPIUM-ALBUTEROL 0.5-2.5 (3) MG/3ML IN SOLN: 3.0000 mL | Freq: Four times a day (QID) | RESPIRATORY_TRACT | Status: DC | PRN

## 2019-02-21 MED ORDER — MIDAZOLAM HCL 2 MG/2ML IJ SOLN
2.0000 mg | INTRAMUSCULAR | Status: DC | PRN
  Filled 2019-02-21: qty 2

## 2019-02-21 MED ORDER — FOLIC ACID 1 MG PO TABS
1.0000 mg | ORAL_TABLET | Freq: Every day | ORAL | Status: DC
  Administered 2019-02-22 – 2019-02-24 (×3): 1 mg via ORAL
  Filled 2019-02-21 (×3): qty 1

## 2019-02-21 MED ORDER — POTASSIUM CHLORIDE 10 MEQ/100ML IV SOLN
10.0000 meq | INTRAVENOUS | Status: AC
  Administered 2019-02-21 (×3): 10 meq via INTRAVENOUS
  Filled 2019-02-21 (×3): qty 100

## 2019-02-21 MED ORDER — ALPRAZOLAM 0.5 MG PO TABS
1.0000 mg | ORAL_TABLET | Freq: Three times a day (TID) | ORAL | Status: DC | PRN
  Filled 2019-02-21: qty 2

## 2019-02-21 MED ORDER — LORAZEPAM 2 MG/ML IJ SOLN: 1.0000 mg | Freq: Four times a day (QID) | INTRAMUSCULAR | Status: DC | PRN

## 2019-02-21 MED ORDER — LORAZEPAM 1 MG PO TABS: 1.0000 mg | ORAL_TABLET | Freq: Four times a day (QID) | ORAL | Status: DC | PRN

## 2019-02-21 MED ORDER — PANTOPRAZOLE SODIUM 40 MG PO TBEC
40.0000 mg | DELAYED_RELEASE_TABLET | Freq: Every day | ORAL | Status: DC
  Administered 2019-02-22 – 2019-02-23 (×2): 40 mg via ORAL
  Filled 2019-02-21 (×2): qty 1

## 2019-02-21 MED ORDER — LORAZEPAM 1 MG PO TABS
0.0000 mg | ORAL_TABLET | Freq: Four times a day (QID) | ORAL | Status: DC
  Administered 2019-02-21: 2 mg via ORAL
  Filled 2019-02-21: qty 2

## 2019-02-21 MED ORDER — DULOXETINE HCL 60 MG PO CPEP
60.0000 mg | ORAL_CAPSULE | Freq: Every day | ORAL | Status: DC
  Administered 2019-02-21: 30 mg via ORAL
  Administered 2019-02-22 – 2019-02-24 (×3): 60 mg via ORAL
  Filled 2019-02-21 (×2): qty 1
  Filled 2019-02-21: qty 2
  Filled 2019-02-21: qty 1

## 2019-02-21 MED ORDER — ORAL CARE MOUTH RINSE
15.0000 mL | OROMUCOSAL | Status: DC
  Administered 2019-02-21 (×3): 15 mL via OROMUCOSAL

## 2019-02-21 MED ORDER — VITAMIN B-1 100 MG PO TABS
100.0000 mg | ORAL_TABLET | Freq: Every day | ORAL | Status: DC
  Administered 2019-02-22 – 2019-02-24 (×3): 100 mg via ORAL
  Filled 2019-02-21 (×3): qty 1

## 2019-02-21 MED ORDER — PROPOFOL 1000 MG/100ML IV EMUL
5.0000 ug/kg/min | INTRAVENOUS | Status: DC
  Administered 2019-02-20: 23:00:00 25 ug/kg/min via INTRAVENOUS
  Filled 2019-02-21: qty 100

## 2019-02-21 MED ORDER — SODIUM CHLORIDE 0.9 % IV SOLN
3.0000 g | Freq: Four times a day (QID) | INTRAVENOUS | Status: DC
  Administered 2019-02-21 (×2): 3 g via INTRAVENOUS
  Filled 2019-02-21 (×5): qty 3

## 2019-02-21 MED ORDER — SENNOSIDES 8.8 MG/5ML PO SYRP
5.0000 mL | ORAL_SOLUTION | Freq: Two times a day (BID) | ORAL | Status: DC | PRN
  Filled 2019-02-21: qty 5

## 2019-02-21 MED ORDER — PNEUMOCOCCAL VAC POLYVALENT 25 MCG/0.5ML IJ INJ
0.5000 mL | INJECTION | INTRAMUSCULAR | Status: DC
  Filled 2019-02-21 (×2): qty 0.5

## 2019-02-21 MED ORDER — IPRATROPIUM-ALBUTEROL 0.5-2.5 (3) MG/3ML IN SOLN
3.0000 mL | Freq: Four times a day (QID) | RESPIRATORY_TRACT | Status: DC
  Administered 2019-02-21 (×2): 3 mL via RESPIRATORY_TRACT
  Filled 2019-02-21 (×2): qty 3

## 2019-02-21 MED ORDER — ADULT MULTIVITAMIN W/MINERALS CH
1.0000 | ORAL_TABLET | Freq: Every day | ORAL | Status: DC
  Administered 2019-02-21 – 2019-02-24 (×4): 1 via ORAL
  Filled 2019-02-21 (×4): qty 1

## 2019-02-21 MED ORDER — CHLORHEXIDINE GLUCONATE 0.12% ORAL RINSE (MEDLINE KIT)
15.0000 mL | Freq: Two times a day (BID) | OROMUCOSAL | Status: DC
  Administered 2019-02-21 – 2019-02-23 (×5): 15 mL via OROMUCOSAL

## 2019-02-21 NOTE — Progress Notes (Signed)
OG tube advanced from 48cm to 60cm per Burman Nieves, NP and abd xray ordered to confirm correct placement.

## 2019-02-21 NOTE — Progress Notes (Signed)
Pharmacy Antibiotic Note  Toni Friedman is a 38 y.o. female admitted on 02/20/2019 with aspiration pneumonia.  Pharmacy has been consulted for Unasyn dosing.  Plan: Will start patient on Unasyn 3g IV q6h per CrCl > 30 ml/min Will continue to monitor renal function and s/sx of infection.  Height: 5\' 2"  (157.5 cm) Weight: 175 lb 0.7 oz (79.4 kg) IBW/kg (Calculated) : 50.1  Temp (24hrs), Avg:96.2 F (35.7 C), Min:95.3 F (35.2 C), Max:97.2 F (36.2 C)  Recent Labs  Lab 02/20/19 1939 02/20/19 2330  WBC 9.9 9.6  CREATININE 0.87 0.72    Estimated Creatinine Clearance: 93 mL/min (by C-G formula based on SCr of 0.72 mg/dL).    No Known Allergies  Thank you for allowing pharmacy to be a part of this patient's care.  Tobie Lords, PharmD, BCPS Clinical Pharmacist 02/21/2019 1:22 AM

## 2019-02-21 NOTE — Progress Notes (Signed)
Pt is verbally abusive and aggressive. Pt has expressed that staff is trying to kill her by placing their knee on her neck and poisoning her water. Pt has been yelling out and has tried to hit staff at times. Pt is also belligerent and verbally threatening staff. 1:1 safety observation continues at this time.

## 2019-02-21 NOTE — Progress Notes (Signed)
Spoke with April, RN from ED concerning OG tube needing to be advanced by 13 cm per recommendation from radiologist. April, RN states that she spoke with Arrie Aran, RN who did advance OG tube by 12cm.

## 2019-02-21 NOTE — Progress Notes (Signed)
Refuses meds. Flight of ideas. Angry one minute, sleeping the next. Screaming and threatening multiple staff members throughout the day. Throwing food. Delusional. Security called multiple times.

## 2019-02-21 NOTE — Progress Notes (Signed)
Post Abg Fio2 to 25%

## 2019-02-21 NOTE — Progress Notes (Signed)
Patient came to floor, alert no signs of distress, brought an empty bottle of cyclobenzaprine, placed in patients bin

## 2019-02-21 NOTE — Progress Notes (Signed)
Pt has continued to be belligerent and verbally aggressive throughout the shift. Pt also seems to be very confused; at times she is threatening staff and then at other times she is screaming at the phone about her dogs and trash in her yard. Security has had to be called twice this shift.

## 2019-02-21 NOTE — Progress Notes (Signed)
   02/21/19 1650  Clinical Encounter Type  Visited With Patient  Visit Type Initial  Referral From Nurse  Consult/Referral To Chaplain  Spiritual Encounters  Spiritual Needs Emotional  Patient was awake upon arrival. Could not hold lucid conversation with patient. Seemed confused.

## 2019-02-21 NOTE — Progress Notes (Signed)
Patient's normal blood pressure is 82/50   Her BP elevated when she was upset.  Then it went back down.  MD said to put a note, so staff would know her normal BP is low.  Phillis Knack, RN

## 2019-02-21 NOTE — Consult Note (Signed)
PULMONARY / CRITICAL CARE MEDICINE  Name: Toni Friedman D Kolodziej MRN: [id number] DOB: 07/03/1981    LOS: 1  Referring Provider: Dr. Arville CareMansy   Reason for Referral: Overdose with subsequent respiratory failure Brief patient description: 38 year old female with a medical history as indicated below who was found down by EMS transferred today emergency room for possible overdose; intubated for airway protection.  HPI: This is a 38 year old female with a medical history as indicated below who presented to the ED via EMS after being found unresponsive.  History is obtained from ED records as patient is currently intubated and sedated.  Patient is alleged to have developed overdose on Flexeril and gabapentin.  Exact amount of medications taken is unknown.  She was given Narcan in the field by EMS without any significant improvement in her mentation.  At the emergency room, patient was emergently intubated for airway protection.  She is being admitted to the ICU for further management.  Her ED work-up was significant for mild hypokalemia.  The rest of her work-up was unremarkable.  It is unclear who found patient and how many tablets of Flexeril and gabapentin she took.     Poison Control Center contacted by the ED and recommended close monitoring with supportive care and every 4 hours EKGs as well as Tylenol and aspirin levels.  Initial EKG was unremarkable. Spoke with patient's mother by phone and she indicated that the last time patient was seen normal was at about 9 AM on 02/20/2019.  She was found at about 4:30 PM by her brother.  She is on Cymbalta, gabapentin and Flexeril.  The Flexeril bottle was empty when she was found.  Family is unable to determine whether she took any Cymbalta or gabapentin.   Her urine toxicology was positive for cannabis and tricyclics.  Tylenol and aspirin levels are normal                           Past Medical History:  Diagnosis Date  . Anxiety   . Arthritis    knees  .  Depression   . History of alcohol abuse    Past Surgical History:  Procedure Laterality Date  . KNEE SURGERY Left 2000  . TIBIA IM NAIL INSERTION Right 12/19/2015   Procedure: INTRAMEDULLARY (IM) NAIL TIBIAL;  Surgeon: Kennedy BuckerMichael Menz, MD;  Location: [facility] ORS;  Service: Orthopedics;  Laterality: Right;  . TUBAL LIGATION    . WRIST SURGERY  2000   fracture   No current facility-administered medications on file prior to encounter.    Current Outpatient Medications on File Prior to Encounter  Medication Sig  . cyclobenzaprine (FLEXERIL) 10 MG tablet Take 10 mg by mouth 3 (three) times daily as needed for muscle spasms.  . DULoxetine (CYMBALTA) 60 MG capsule Take 60 mg by mouth daily.  Marland Kitchen. gabapentin (NEURONTIN) 600 MG tablet Take 600 mg by mouth 3 (three) times daily.    Allergies No Known Allergies  Family History Family History  Problem Relation Age of Onset  . Lupus Mother   . Diabetes Father   . Cancer Neg Hx   . COPD Neg Hx   . Heart disease Neg Hx   . Hypertension Neg Hx   . Stroke Neg Hx    Social History  reports that she has quit smoking. She smoked 0.50 packs per day. She has never used smokeless tobacco. She reports that she does not drink alcohol or use drugs.  Review  Of Systems: Unable to obtain as patient is currently intubated and sedated  VITAL SIGNS: BP (!) 124/94 (BP Location: Left Arm)   Pulse 100   Temp (!) 96.4 F (35.8 C)   Resp 14   Ht 5\' 2"  (1.575 m)   Wt 79.4 kg   LMP  (LMP Unknown) Comment: neg preg test  SpO2 100%   BMI 32.02 kg/m   HEMODYNAMICS:    VENTILATOR SETTINGS: Vent Mode: PRVC FiO2 (%):  [30 %] 30 % Set Rate:  [14 bmp] 14 bmp Vt Set:  [470 mL] 470 mL PEEP:  [5 cmH20] 5 cmH20 Plateau Pressure:  [14 cmH20] 14 cmH20  INTAKE / OUTPUT: No intake/output data recorded.  PHYSICAL EXAMINATION: General: Well-nourished, well-developed, in no acute distress HEENT: Normocephalic and atraumatic, PERRLA, trachea midline, no JVD Neuro:  Corneal and gag reflexes intact, withdraws to pain Cardiovascular: Apical pulse regular, S1-S2, no murmur regurg or gallop, plus pulses bilaterally, no edema Lungs: Bilateral breath sounds, diminished in the bases, no wheezes or rhonchi Abdomen: Nondistended, normal bowel sounds in all 4 quadrants, palpation reveals no organomegaly Musculoskeletal: Positive range of motion, no joint deformities Skin: Warm and dry, small approximately 5 cm sternal bruise from sternal rub  LABS:  BMET Recent Labs  Lab 02/20/19 1939  NA 137  K 3.4*  CL 104  CO2 24  BUN 11  CREATININE 0.87  GLUCOSE 84    Electrolytes Recent Labs  Lab 02/20/19 1939  CALCIUM 8.6*    CBC Recent Labs  Lab 02/20/19 1939 02/20/19 2330  WBC 9.9 9.6  HGB 11.6* 11.6*  HCT 34.9* 35.0*  PLT 390 380    Coag's No results for input(s): APTT, INR in the last 168 hours.  Sepsis Markers No results for input(s): LATICACIDVEN, PROCALCITON, O2SATVEN in the last 168 hours.  ABG Recent Labs  Lab 02/20/19 2009  PHART 7.43  PCO2ART 34  PO2ART 175*    Liver Enzymes Recent Labs  Lab 02/20/19 1939  AST 20  ALT 20  ALKPHOS 18*  BILITOT 0.9  ALBUMIN 4.2    Cardiac Enzymes No results for input(s): TROPONINI, PROBNP in the last 168 hours.  Glucose Recent Labs  Lab 02/20/19 2321  GLUCAP 80    Imaging Dg Chest 1 View  Result Date: 02/20/2019 CLINICAL DATA:  38 year old female status post intubation. Former smoker. EXAM: CHEST  1 VIEW COMPARISON:  No priors. FINDINGS: An endotracheal tube is in place with tip 5.6 cm above the carina. Nasogastric tube terminating in the distal esophagus. Lung volumes are low. No consolidative airspace disease. No pleural effusions. No pneumothorax. No pulmonary nodule or mass noted. Pulmonary vasculature and the cardiomediastinal silhouette are within normal limits. IMPRESSION: 1. Support apparatus, as above. Please consider advancement of the nasogastric tube approximately 13  cm for more optimal placement in the stomach. 2. Low lung volumes without radiographic evidence of acute cardiopulmonary disease. Electronically Signed   By: Trudie Reedaniel  Entrikin M.D.   On: 02/20/2019 20:14   Ct Head Wo Contrast  Result Date: 02/20/2019 CLINICAL DATA:  Possible overdose EXAM: CT HEAD WITHOUT CONTRAST TECHNIQUE: Contiguous axial images were obtained from the base of the skull through the vertex without intravenous contrast. COMPARISON:  None. FINDINGS: Brain: No evidence of acute infarction, hemorrhage, hydrocephalus, extra-axial collection or mass lesion/mass effect. Vascular: No hyperdense vessel or unexpected calcification. Skull: Normal. Negative for fracture or focal lesion. Sinuses/Orbits: No acute finding. Other: None. IMPRESSION: No acute intracranial pathology. Electronically Signed   By:  Eddie Candle M.D.   On: 02/20/2019 21:13    STUDIES:  None  CULTURES: Sputum culture  ANTIBIOTICS: Unasyn for possible aspiration  SIGNIFICANT EVENTS: 02/20/2019: Admitted  LINES/TUBES: Peripheral IVs OG tube ET tube Foley catheter  DISCUSSION: 38 year old female with a history of depression and anxiety now presenting with possible overdose on Flexeril.  It is unclear if patient took Cymbalta and gabapentin which are her other maintenance medications.  It is unclear if this was intentional or accidental.  She has been involuntarily committed.  ASSESSMENT / PLAN:  PULMONARY A: Acute hypoxic respiratory failure secondary to overdose Aspiration into airway P:   Full vent support with current settings Weaning trials as tolerated Chest x-ray and ABG as needed Nebulized bronchodilators Pulmonary toileting Respiratory culture COVID-19 screen negative  CARDIOVASCULAR A:  Shock due to hypovolemia and medication side effects P:  IV fluid boluses Maintain mean arterial blood pressure greater than 65 Start Levophed if mean arterial blood pressure remains less than 65 Cycle  cardiac enzymes Serial EKGs per [place]  RENAL A:   Hypokalemia P:   Monitor and replace electrolytes Trend creatinine Monitor I's and O's  Psych A:   Overdose- unclear if this is intentional or unintentional P:   Will initiate psychiatric consult once patient is extubated She will need one-on-one observation per protocol once extubated [facility] contacted by the ED and recommends serial EKGs and supportive care  NEUROLOGIC A:   Acute encephalopathy secondary to medication side effects P:   RASS goal: 0 to -1 We will discontinue propofol in light of hypotension Start Precedex infusion and PRN Versed and fentanyl for sedation and vent discomfort. Monitor neurological status per protocol Follow-up with [facility]  Best Practice: Code Status: Full code Diet: N.p.o. GI prophylaxis: Protonix VTE prophylaxis: Lovenox and SCDs  FAMILY  - Updates: Patient's mother updated on her current health status and plan of care.  All questions answered  Suhey Radford S. Tukov-Yual, ANP-BC Pulmonary and [place] Pager [phone] or [phone]  NB: This document was prepared using Dragon voice recognition software and may include unintentional dictation errors.    02/21/2019, 12:15 AM

## 2019-02-21 NOTE — Progress Notes (Signed)
Ebony Hail from [place] control called, update given.

## 2019-02-21 NOTE — Progress Notes (Signed)
Patient tried to pull out her vent tube and OG tube.  RN and respiratory therapist tried to calm and comfort patient and explain that her oxygen was 100%.  RN explained that RT would pull the tube in a minute that if patient could she understand, please nod her head, there will be fluid we need to suction.  Patient got agitated and aggressive and tried to scream that she could not breathe.  RT pulled the tubes and then patient became aggressive and was cursing and yelling saying RN tried to kill her and she would complain.  She wanted a new nurse.  Charge nurse and another nurse came into the room to help as well as the MD.  Patient calmed down.  MD and RNs offered patient food, made sure the phone was working and offered her the phone.  She cursed and refused.  The phone was left in the room for her.  A sitter is with her because she is here based on  involuntary commitment.  Charge nurse made clear to the patient that she should not abuse staff.  She calmed down and is now sleeping.  RN called the patient's mother and made her aware that she is awake and stable now.  The mother was very happy and very thankful.  Phillis Knack, RN

## 2019-02-21 NOTE — Consult Note (Signed)
[facility] Consult   Reason for Consult:  overdose Referring Physician:  Srikar Patient Identification: Toni Friedman MRN:  [id number] Principal Diagnosis: MDD/r/o BPAD Diagnosis:  Active Problems:   Overdose   Severe episode of recurrent major depressive disorder, without psychotic features ([place])   Total Time spent with patient: 45 minutes  Subjective:   Toni Friedman is a 38 y.o. female patient admitted after an overdose on Flexeril and gabapentin.    HPI:  Toni Friedman is a 38 y.o. divorced female patient admitted after an overdose on Flexeril and gabapentin. Her drug screen reflects the abuse of cannabis and of course the tricyclic structure of Flexeril.  She was found by her brother she did not leave a note or notify anyone that she had overdosed.  She has been extubated now her voice is soft and little raspy but she does answer some questions.  When asked her if she really intended to die with the overdose she begins to cry and elaborates that that was her intent.  When asked if she feels safe here she says she does feel safe here. She has no plans to harm herself here.  When asked if she has had prior suicide attempts she elaborates that she wants thought of shooting herself, her mother, phone with her permission, states that in the past the patient did indeed try to shoot herself but apparently the bullet was the wrong caliber size for the gun she had access to and her self-harm attempt simply fizzled at that point but she, at the present time, has no access to firearms.  Patient has a family history of depression, she herself has abused cannabis since she was a teen and has done so chronically but probably not daily according to her mother.  She also has lost her license due to a DUI charge.  After she divorced from her husband she drifted into alcoholism according to her mother but she is not had anything to drink in over a year but she still does not have driving  privileges and this seemed to trigger the overdose.  There was dispute with her mother about picking her up at a certain time and the mother was unaware of this, she sent the brother to check on her because she "did not want to get an argument".  And of course the brother had found her after an overdose. When I asked the patient why she was so stressed an overdose she begins crying and states "I do not want to talk about it"  Toni Friedman, according to family members has a great deal of mood instability sometimes she is upbeat and sometimes she is angry and irritable for no reason and argumentative and at other times is of course dysphoric and depressed and the family tries to back off and "let her have her spells" and engage her when the spells wear off.  Her mother is familiar with depression and thinks this is beyond depression as the patient again has a great deal of instability in her mood and further she expresses not formed paranoid delusions but just the idea of the people are generally working against her.  She is also let her house chores go undone for prolonged period of time and her house is in disarray although her basic hygiene is intact.  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  Past Psychiatric History: Patient has been prescribed Cymbalta and Flexeril and gabapentin by her primary care provider, Dr. Dema Severin at [facility] primary care in [facility] but she has had no prior inpatient or outpatient therapy from a psychiatric standpoint.  Risk to Self:  High Risk to Others:  Negligible Prior Inpatient Therapy:  Negative Prior Outpatient Therapy:  As per primary care  Past Medical History:  Past Medical History:  Diagnosis Date  . Anxiety   . Arthritis    knees  . Depression   . History of alcohol abuse     Past Surgical History:  Procedure Laterality Date  . KNEE SURGERY Left 2000  . TIBIA IM  NAIL INSERTION Right 12/19/2015   Procedure: INTRAMEDULLARY (IM) NAIL TIBIAL;  Surgeon: Hessie Knows, MD;  Location: [facility] ORS;  Service: Orthopedics;  Laterality: Right;  . TUBAL LIGATION    . WRIST SURGERY  2000   fracture   Family History:  Family History  Problem Relation Age of Onset  . Lupus Mother   . Diabetes Father   . Cancer Neg Hx   . COPD Neg Hx   . Heart disease Neg Hx   . Hypertension Neg Hx   . Stroke Neg Hx    Family Psychiatric  History: MDD in mother- mother side - mother take Cymblata  Social History:  Social History   Substance and Sexual Activity  Alcohol Use No   Comment: socially     Social History   Substance and Sexual Activity  Drug Use No    Social History   Socioeconomic History  . Marital status: Single    Spouse name: Not on file  . Number of children: Not on file  . Years of education: Not on file  . Highest education level: Not on file  Occupational History  . Not on file  Social Needs  . Financial resource strain: Not on file  . Food insecurity:    Worry: Not on file    Inability: Not on file  . Transportation needs:    Medical: Not on file    Non-medical: Not on file  Tobacco Use  . Smoking status: Former Smoker    Packs/day: 0.50  . Smokeless tobacco: Never Used  Substance and Sexual Activity  . Alcohol use: No    Comment: socially  . Drug use: No  . Sexual activity: Not on file  Lifestyle  . Physical activity:    Days per week: Not on file    Minutes per session: Not on file  . Stress: Not on file  Relationships  . Social connections:    Talks on phone: Not on file    Gets together: Not on file    Attends religious service: Not on file    Active member of club or organization: Not on file    Attends meetings of clubs or organizations: Not on file    Relationship status: Not on file  Other Topics Concern  . Not on file  Social History Narrative  . Not on file   Additional Social History: Patient is divorced,  she lives in the vicinity of family members, she has 1 DUI which resulted in the loss of her license.    Allergies:  No Known Allergies  Labs:  Results for orders placed or performed during the hospital encounter of 02/20/19 (from the past 48 hour(s))  CBC with Differential     Status: Abnormal   Collection Time: 02/20/19  7:39 PM  Result  Value Ref Range   WBC 9.9 4.0 - 10.5 K/uL   RBC 3.85 (L) 3.87 - 5.11 MIL/uL   Hemoglobin 11.6 (L) 12.0 - 15.0 g/dL   HCT 34.9 (L) 36.0 - 46.0 %   MCV 90.6 80.0 - 100.0 fL   MCH 30.1 26.0 - 34.0 pg   MCHC 33.2 30.0 - 36.0 g/dL   RDW 13.4 11.5 - 15.5 %   Platelets 390 150 - 400 K/uL   nRBC 0.0 0.0 - 0.2 %   Neutrophils Relative % 62 %   Neutro Abs 6.2 1.7 - 7.7 K/uL   Lymphocytes Relative 27 %   Lymphs Abs 2.7 0.7 - 4.0 K/uL   Monocytes Relative 7 %   Monocytes Absolute 0.7 0.1 - 1.0 K/uL   Eosinophils Relative 2 %   Eosinophils Absolute 0.2 0.0 - 0.5 K/uL   Basophils Relative 1 %   Basophils Absolute 0.1 0.0 - 0.1 K/uL   Immature Granulocytes 1 %   Abs Immature Granulocytes 0.06 0.00 - 0.07 K/uL    Comment: Performed at [facility], [address] [place], [place] [address]  Comprehensive metabolic panel     Status: Abnormal   Collection Time: 02/20/19  7:39 PM  Result Value Ref Range   Sodium 137 135 - 145 mmol/L   Potassium 3.4 (L) 3.5 - 5.1 mmol/L   Chloride 104 98 - 111 mmol/L   CO2 24 22 - 32 mmol/L   Glucose, Bld 84 70 - 99 mg/dL   BUN 11 6 - 20 mg/dL   Creatinine, Ser 0.87 0.44 - 1.00 mg/dL   Calcium 8.6 (L) 8.9 - 10.3 mg/dL   Total Protein 7.8 6.5 - 8.1 g/dL   Albumin 4.2 3.5 - 5.0 g/dL   AST 20 15 - 41 U/L   ALT 20 0 - 44 U/L   Alkaline Phosphatase 18 (L) 38 - 126 U/L   Total Bilirubin 0.9 0.3 - 1.2 mg/dL   GFR calc non Af Amer >60 >60 mL/min   GFR calc Af Amer >60 >60 mL/min   Anion gap 9 5 - 15    Comment: Performed at [facility], [address] [place], [place] [address]  Ethanol     Status:  None   Collection Time: 02/20/19  7:39 PM  Result Value Ref Range   Alcohol, Ethyl (B) <10 <10 mg/dL    Comment: (NOTE) Lowest detectable limit for serum alcohol is 10 mg/dL. For medical purposes only. Performed at [facility], [address] [place], [place] [address]   Urinalysis, Complete w Microscopic     Status: Abnormal   Collection Time: 02/20/19  7:40 PM  Result Value Ref Range   Color, Urine STRAW (A) YELLOW   APPearance CLEAR (A) CLEAR   Specific Gravity, Urine 1.005 1.005 - 1.030   pH 6.0 5.0 - 8.0   Glucose, UA NEGATIVE NEGATIVE mg/dL   Hgb urine dipstick NEGATIVE NEGATIVE   Bilirubin Urine NEGATIVE NEGATIVE   Ketones, ur 5 (A) NEGATIVE mg/dL   Protein, ur NEGATIVE NEGATIVE mg/dL   Nitrite NEGATIVE NEGATIVE   Leukocytes,Ua NEGATIVE NEGATIVE   WBC, UA 0-5 0 - 5 WBC/hpf   Bacteria, UA NONE SEEN NONE SEEN   Squamous Epithelial / LPF 0-5 0 - 5    Comment: Performed at [facility], [address] [place], [place] [address]  Pregnancy, urine     Status: None   Collection Time: 02/20/19  7:40 PM  Result Value Ref Range   Preg Test, Ur NEGATIVE NEGATIVE    Comment: Performed at [facility], [address] [place], [place] [address]  Urine Drug Screen, Qualitative [facility] only)     Status: Abnormal   Collection Time: 02/20/19  7:40 PM  Result Value Ref Range   Tricyclic, Ur Screen POSITIVE (A) NONE DETECTED   Amphetamines, Ur Screen NONE DETECTED NONE DETECTED   MDMA (Ecstasy)Ur Screen NONE DETECTED NONE DETECTED   Cocaine Metabolite,Ur [place] NONE DETECTED NONE DETECTED   Opiate, Ur Screen NONE DETECTED NONE DETECTED   Phencyclidine (PCP) Ur S NONE DETECTED NONE DETECTED   Cannabinoid 50 Ng, Ur [place] POSITIVE (A) NONE DETECTED   Barbiturates, Ur Screen NONE DETECTED NONE DETECTED   Benzodiazepine, Ur Scrn NONE DETECTED NONE DETECTED   Methadone Scn, Ur NONE DETECTED NONE DETECTED    Comment: (NOTE) Tricyclics + metabolites, urine    Cutoff  1000 ng/mL Amphetamines + metabolites, urine  Cutoff 1000 ng/mL MDMA (Ecstasy), urine              Cutoff 500 ng/mL Cocaine Metabolite, urine          Cutoff 300 ng/mL Opiate + metabolites, urine        Cutoff 300 ng/mL Phencyclidine (PCP), urine         Cutoff 25 ng/mL Cannabinoid, urine                 Cutoff 50 ng/mL Barbiturates + metabolites, urine  Cutoff 200 ng/mL Benzodiazepine, urine              Cutoff 200 ng/mL Methadone, urine                   Cutoff 300 ng/mL The urine drug screen provides only a preliminary, unconfirmed analytical test result and should not be used for non-medical purposes. Clinical consideration and professional judgment should be applied to any positive drug screen result due to possible interfering substances. A more specific alternate chemical method must be used in order to obtain a confirmed analytical result. Gas chromatography / mass spectrometry (GC/MS) is the preferred confirmat ory method. Performed at [facility], [address] [place], [place] [address]   Blood gas, arterial ([facility] & [facility] ONLY)     Status: Abnormal   Collection Time: 02/20/19  8:09 PM  Result Value Ref Range   FIO2 0.30    Delivery systems VENTILATOR    Mode ASSIST CONTROL    VT 470 mL   Peep/cpap 5.0 cm H20   pH, Arterial 7.43 7.350 - 7.450   pCO2 arterial 34 32.0 - 48.0 mmHg   pO2, Arterial 175 (H) 83.0 - 108.0 mmHg   Bicarbonate 22.6 20.0 - 28.0 mmol/L   Acid-base deficit 1.1 0.0 - 2.0 mmol/L   O2 Saturation 99.6 %   Patient temperature 37.0    Collection site LEFT RADIAL    Sample type ARTERIAL DRAW    Allens test (pass/fail) PASS PASS   Mechanical Rate 14     Comment: Performed at [facility], [address], [place], [place] [address]  SARS Coronavirus 2 (CEPHEID - Performed in [place] hospital lab), Hosp Order     Status: None   Collection Time: 02/20/19  8:18 PM  Result Value Ref Range   SARS Coronavirus 2 NEGATIVE NEGATIVE     Comment: (NOTE) If result is NEGATIVE SARS-CoV-2 target nucleic acids are NOT DETECTED. The SARS-CoV-2 RNA is generally detectable  in upper and lower  respiratory specimens during the acute phase of infection. The lowest  concentration of SARS-CoV-2 viral copies this assay can detect is 250  copies / mL. A negative result does not preclude SARS-CoV-2 infection  and should not be used as the sole basis for treatment or other  patient management decisions.  A negative result may occur with  improper specimen collection / handling, submission of specimen other  than nasopharyngeal swab, presence of viral mutation(s) within the  areas targeted by this assay, and inadequate number of viral copies  (<250 copies / mL). A negative result must be combined with clinical  observations, patient history, and epidemiological information. If result is POSITIVE SARS-CoV-2 target nucleic acids are DETECTED. The SARS-CoV-2 RNA is generally detectable in upper and lower  respiratory specimens dur ing the acute phase of infection.  Positive  results are indicative of active infection with SARS-CoV-2.  Clinical  correlation with patient history and other diagnostic information is  necessary to determine patient infection status.  Positive results do  not rule out bacterial infection or co-infection with other viruses. If result is PRESUMPTIVE POSTIVE SARS-CoV-2 nucleic acids MAY BE PRESENT.   A presumptive positive result was obtained on the submitted specimen  and confirmed on repeat testing.  While 2019 novel coronavirus  (SARS-CoV-2) nucleic acids may be present in the submitted sample  additional confirmatory testing may be necessary for epidemiological  and / or clinical management purposes  to differentiate between  SARS-CoV-2 and other Sarbecovirus currently known to infect humans.  If clinically indicated additional testing with an alternate test  methodology [phone]) is advised. The SARS-CoV-2  RNA is generally  detectable in upper and lower respiratory sp ecimens during the acute  phase of infection. The expected result is Negative. Fact Sheet for Patients:  StrictlyIdeas.no Fact Sheet for Healthcare Providers: BankingDealers.co.za This test is not yet approved or cleared by the [place] FDA and has been authorized for detection and/or diagnosis of SARS-CoV-2 by FDA under an Emergency Use Authorization (EUA).  This EUA will remain in effect (meaning this test can be used) for the duration of the COVID-19 declaration under Section 564(b)(1) of the Act, 21 U.S.C. section 360bbb-3(b)(1), unless the authorization is terminated or revoked sooner. Performed at [facility], [address] [place], [place] [address]   Glucose, capillary     Status: None   Collection Time: 02/20/19 11:21 PM  Result Value Ref Range   Glucose-Capillary 80 70 - 99 mg/dL  MRSA PCR Screening     Status: None   Collection Time: 02/20/19 11:24 PM  Result Value Ref Range   MRSA by PCR NEGATIVE NEGATIVE    Comment:        The GeneXpert MRSA Assay (FDA approved for NASAL specimens only), is one component of a comprehensive MRSA colonization surveillance program. It is not intended to diagnose MRSA infection nor to guide or monitor treatment for MRSA infections. Performed at [facility], [address] [place], [place] [address]   Comprehensive metabolic panel     Status: Abnormal   Collection Time: 02/20/19 11:30 PM  Result Value Ref Range   Sodium 139 135 - 145 mmol/L   Potassium 3.4 (L) 3.5 - 5.1 mmol/L   Chloride 109 98 - 111 mmol/L   CO2 22 22 - 32 mmol/L   Glucose, Bld 90 70 - 99 mg/dL   BUN 10 6 - 20 mg/dL   Creatinine, Ser 0.72 0.44 -  1.00 mg/dL   Calcium 8.4 (L) 8.9 - 10.3 mg/dL   Total Protein 7.5 6.5 - 8.1 g/dL   Albumin 3.8 3.5 - 5.0 g/dL   AST 19 15 - 41 U/L   ALT 18 0 - 44 U/L   Alkaline Phosphatase 20  (L) 38 - 126 U/L   Total Bilirubin 0.9 0.3 - 1.2 mg/dL   GFR calc non Af Amer >60 >60 mL/min   GFR calc Af Amer >60 >60 mL/min   Anion gap 8 5 - 15    Comment: Performed at [facility], [address] [place], [place] [address]  Acetaminophen level     Status: Abnormal   Collection Time: 02/20/19 11:30 PM  Result Value Ref Range   Acetaminophen (Tylenol), Serum <10 (L) 10 - 30 ug/mL    Comment: (NOTE) Therapeutic concentrations vary significantly. A range of 10-30 ug/mL  may be an effective concentration for many patients. However, some  are best treated at concentrations outside of this range. Acetaminophen concentrations >150 ug/mL at 4 hours after ingestion  and >50 ug/mL at 12 hours after ingestion are often associated with  toxic reactions. Performed at [facility], [address] [place], [place] [address]   Troponin I - Once     Status: None   Collection Time: 02/20/19 11:30 PM  Result Value Ref Range   Troponin I <0.03 <0.03 ng/mL    Comment: Performed at [facility], [address] [place], [place] [address]  Salicylate level     Status: None   Collection Time: 02/20/19 11:30 PM  Result Value Ref Range   Salicylate Lvl <8.6 2.8 - 30.0 mg/dL    Comment: Performed at [facility], [address] [place], [place] [address]  TSH     Status: Abnormal   Collection Time: 02/20/19 11:30 PM  Result Value Ref Range   TSH 4.939 (H) 0.350 - 4.500 uIU/mL    Comment: Performed by a 3rd Generation assay with a functional sensitivity of <=0.01 uIU/mL. Performed at [facility], [address] [place],  [address]   CBC     Status: Abnormal   Collection Time: 02/20/19 11:30 PM  Result Value Ref Range   WBC 9.6 4.0 - 10.5 K/uL   RBC 3.85 (L) 3.87 - 5.11 MIL/uL   Hemoglobin 11.6 (L) 12.0 - 15.0 g/dL   HCT 35.0 (L) 36.0 - 46.0 %   MCV 90.9 80.0 - 100.0 fL   MCH 30.1 26.0 - 34.0 pg   MCHC 33.1 30.0 - 36.0 g/dL   RDW  13.4 11.5 - 15.5 %   Platelets 380 150 - 400 K/uL   nRBC 0.0 0.0 - 0.2 %    Comment: Performed at [facility], [address] [place],  [address]  Blood gas, arterial     Status: Abnormal   Collection Time: 02/21/19  4:36 AM  Result Value Ref Range   FIO2 0.30    Delivery systems VENTILATOR    Mode PRESSURE REGULATED VOLUME CONTROL    VT 470.0 mL   Peep/cpap 5.0 cm H20   pH, Arterial 7.37 7.350 - 7.450   pCO2 arterial 36 32.0 - 48.0 mmHg   pO2, Arterial 139 (H) 83.0 - 108.0 mmHg   Bicarbonate 20.8 20.0 - 28.0 mmol/L   Acid-base deficit 3.9 (H) 0.0 - 2.0 mmol/L   O2 Saturation 99.1 %   Patient temperature 37.0    Collection site LEFT RADIAL  Sample type ARTERIAL DRAW    Allens test (pass/fail) PASS PASS   Mechanical Rate 14     Comment: Performed at [facility], [address] [place], [place] [address]  CBC     Status: Abnormal   Collection Time: 02/21/19  4:49 AM  Result Value Ref Range   WBC 8.1 4.0 - 10.5 K/uL   RBC 3.17 (L) 3.87 - 5.11 MIL/uL   Hemoglobin 9.5 (L) 12.0 - 15.0 g/dL   HCT 29.5 (L) 36.0 - 46.0 %   MCV 93.1 80.0 - 100.0 fL   MCH 30.0 26.0 - 34.0 pg   MCHC 32.2 30.0 - 36.0 g/dL   RDW 13.4 11.5 - 15.5 %   Platelets 308 150 - 400 K/uL   nRBC 0.0 0.0 - 0.2 %    Comment: Performed at [facility], [address] [place], [place] [address]  Protime-INR     Status: None   Collection Time: 02/21/19  4:49 AM  Result Value Ref Range   Prothrombin Time 14.5 11.4 - 15.2 seconds   INR 1.1 0.8 - 1.2    Comment: (NOTE) INR goal varies based on device and disease states. Performed at [facility], [address] [place], [place] [address]   Basic metabolic panel     Status: Abnormal   Collection Time: 02/21/19  4:49 AM  Result Value Ref Range   Sodium 139 135 - 145 mmol/L   Potassium 3.8 3.5 - 5.1 mmol/L   Chloride 114 (H) 98 - 111 mmol/L   CO2 20 (L) 22 - 32 mmol/L   Glucose, Bld 109 (H) 70 - 99 mg/dL    BUN 10 6 - 20 mg/dL   Creatinine, Ser 0.79 0.44 - 1.00 mg/dL   Calcium 7.1 (L) 8.9 - 10.3 mg/dL   GFR calc non Af Amer >60 >60 mL/min   GFR calc Af Amer >60 >60 mL/min   Anion gap 5 5 - 15    Comment: Performed at [facility], [address] [place], [place] [address]  Troponin I - Now Then Q6H     Status: None   Collection Time: 02/21/19  4:49 AM  Result Value Ref Range   Troponin I <0.03 <0.03 ng/mL    Comment: Performed at [facility], [address] [place], [place] [address]  Magnesium     Status: None   Collection Time: 02/21/19  4:49 AM  Result Value Ref Range   Magnesium 2.0 1.7 - 2.4 mg/dL    Comment: Performed at [facility], [address] [place], [place] [address]  Phosphorus     Status: None   Collection Time: 02/21/19  4:49 AM  Result Value Ref Range   Phosphorus 3.2 2.5 - 4.6 mg/dL    Comment: Performed at [facility], [address] [place], [place] [address]  Glucose, capillary     Status: Abnormal   Collection Time: 02/21/19  5:31 AM  Result Value Ref Range   Glucose-Capillary 107 (H) 70 - 99 mg/dL    Current Facility-Administered Medications  Medication Dose Route Frequency Provider Last Rate Last Dose  . 0.9 %  sodium chloride infusion   Intravenous Continuous Seals, Klare Criss, NP 100 mL/hr at 02/21/19 0659    . Ampicillin-Sulbactam (UNASYN) 3 g in sodium chloride 0.9 % 100 mL IVPB  3 g Intravenous Q6H Tukov-Yual, Magdalene S, NP 200 mL/hr at 02/21/19 0659    . bisacodyl (DULCOLAX) suppository 10  mg  10 mg Rectal Daily PRN Tukov-Yual, Magdalene S, NP      . chlorhexidine gluconate (MEDLINE KIT) (PERIDEX) 0.12 % solution 15 mL  15 mL Mouth Rinse BID Tukov-Yual, Magdalene S, NP   15 mL at 02/21/19 0800  . Chlorhexidine Gluconate Cloth 2 % PADS 6 each  6 each Topical Q0600 Tukov-Yual, Magdalene S, NP   6 each at 02/21/19 0511  . dexmedetomidine (PRECEDEX) 400 MCG/100ML (4 mcg/mL) infusion  0-1.2 mcg/kg/hr Intravenous  Continuous Tukov-Yual, Magdalene S, NP   Stopped at 02/21/19 0900  . enoxaparin (LOVENOX) injection 40 mg  40 mg Subcutaneous Q24H Seals, Angela H, NP   40 mg at 02/20/19 2339  . fentaNYL (SUBLIMAZE) injection 50 mcg  50 mcg Intravenous Q15 min PRN Tukov-Yual, Magdalene S, NP      . fentaNYL (SUBLIMAZE) injection 50-200 mcg  50-200 mcg Intravenous Q30 min PRN Tukov-Yual, Magdalene S, NP      . folic acid injection 1 mg  1 mg Intravenous Daily Seals, Angela H, NP      . ipratropium-albuterol (DUONEB) 0.5-2.5 (3) MG/3ML nebulizer solution 3 mL  3 mL Nebulization Q6H PRN Flora Lipps, MD      . midazolam (VERSED) injection 2-4 mg  2-4 mg Intravenous Q15 min PRN Tukov-Yual, Magdalene S, NP      . midazolam (VERSED) injection 2-4 mg  2-4 mg Intravenous Q2H PRN Tukov-Yual, Magdalene S, NP   2 mg at 02/21/19 0413  . ondansetron (ZOFRAN) tablet 4 mg  4 mg Oral Q6H PRN Seals, Theo Dills, NP       Or  . ondansetron (ZOFRAN) injection 4 mg  4 mg Intravenous Q6H PRN Seals, Angela H, NP      . pantoprazole (PROTONIX) injection 40 mg  40 mg Intravenous Q24H Colbert Coyer, MD   40 mg at 02/21/19 0106  . phenylephrine (NEO-SYNEPHRINE) 10 mg in sodium chloride 0.9 % 250 mL (0.04 mg/mL) infusion  0-200 mcg/min Intravenous Titrated Tukov-Yual, Magdalene S, NP      . sennosides (SENOKOT) 8.8 MG/5ML syrup 5 mL  5 mL Per Tube BID PRN Tukov-Yual, Magdalene S, NP      . thiamine (B-1) injection 100 mg  100 mg Intravenous Daily Seals, Theo Dills, NP        Musculoskeletal: Strength & Muscle Tone: decreased Gait & Station: unable to stand Patient leans: N/A  Psychiatric Specialty Exam: Physical Exam as per HPI  ROS s/p O/D  Blood pressure 97/66, pulse 79, temperature 98.6 F (37 C), resp. rate 18, height '5\' 2"'  (1.575 m), weight 79.4 kg, SpO2 98 %.Body mass index is 32.02 kg/m.  General Appearance: Disheveled  Eye Contact:  None  Speech:  Slow and Slurred  Volume:  Decreased  Mood:  Depressed  Affect:   Congruent and Tearful  Thought Process:  Goal Directed and Descriptions of Associations: Intact  Orientation:  Other:  person/place/situation  Thought Content:  Rumination  Suicidal Thoughts:  Yes.  with intent/plan  Homicidal Thoughts:  No  Memory:  Immediate;   Fair  Judgement:  Fair  Insight:  Present  Psychomotor Activity:  Decreased  Concentration:  Concentration: Good  Recall:  [employer] of Knowledge:  Fair  Language:  Fair  Akathisia:  Negative  Handed:  Right  AIMS (if indicated):     Assets:  Housing Physical Health Social Support Talents/Skills  ADL's:  Impaired  Cognition:  Impaired,  Mild  Sleep:      In  summary Toni Friedman is 33 she overdose with suicidal intent, she was found by family members.  She has a history of 1 DUI, she has cannabis dependency, and she is in need of inpatient stabilization.  She may have a bipolar type condition given the diversity of moods and level of agitation described by family.   Treatment Plan Summary: Daily contact with patient to assess and evaluate symptoms and progress in treatment and Medication management  Disposition: Recommend psychiatric Inpatient admission when medically cleared.  Johnn Hai, MD 02/21/2019 10:37 AM

## 2019-02-21 NOTE — Progress Notes (Signed)
[facility] [place] [facility]   PATIENT NAME: Toni Friedman    MR#:  [id number]  DATE OF BIRTH:  07/22/1981  SUBJECTIVE:  CHIEF COMPLAINT:   Chief Complaint  Patient presents with  . Drug Overdose  Seen earlier.  Still on vent.  Plan is to extubate  REVIEW OF SYSTEMS:    Review of Systems  Unable to perform ROS: Intubated    DRUG ALLERGIES:  No Known Allergies  VITALS:  Blood pressure 97/66, pulse 79, temperature 98.6 F (37 C), resp. rate 18, height 5\' 2"  (1.575 m), weight 79.4 kg, SpO2 98 %.  PHYSICAL EXAMINATION:   Physical Exam  GENERAL:  38 y.o.-year-old patient lying in the bed  EYES: Pupils equal, round, reactive to light and accommodation. No scleral icterus. Extraocular muscles intact.  HEENT: Head atraumatic, normocephalic. Oropharynx and nasopharynx clear. ETT. OGT NECK:  Supple, no jugular venous distention. No thyroid enlargement, no tenderness.  LUNGS: Normal breath sounds bilaterally, no wheezing, rales, rhonchi. No use of accessory muscles of respiration.  CARDIOVASCULAR: S1, S2 normal. No murmurs, rubs, or gallops.  ABDOMEN: Soft, nontender, nondistended. Bowel sounds present. No organomegaly or mass.  EXTREMITIES: No cyanosis, clubbing or edema b/l.    PSYCHIATRIC: Sedated SKIN: No obvious rash, lesion, or ulcer.   LABORATORY PANEL:   CBC Recent Labs  Lab 02/21/19 0449  WBC 8.1  HGB 9.5*  HCT 29.5*  PLT 308   ------------------------------------------------------------------------------------------------------------------ Chemistries  Recent Labs  Lab 02/20/19 2330 02/21/19 0449  NA 139 139  K 3.4* 3.8  CL 109 114*  CO2 22 20*  GLUCOSE 90 109*  BUN 10 10  CREATININE 0.72 0.79  CALCIUM 8.4* 7.1*  MG  --  2.0  AST 19  --   ALT 18  --   ALKPHOS 20*  --   BILITOT 0.9  --    ------------------------------------------------------------------------------------------------------------------  Cardiac  Enzymes Recent Labs  Lab 02/21/19 1109  TROPONINI <0.03   ------------------------------------------------------------------------------------------------------------------  RADIOLOGY:  Dg Chest 1 View  Result Date: 02/20/2019 CLINICAL DATA:  38 year old female status post intubation. Former smoker. EXAM: CHEST  1 VIEW COMPARISON:  No priors. FINDINGS: An endotracheal tube is in place with tip 5.6 cm above the carina. Nasogastric tube terminating in the distal esophagus. Lung volumes are low. No consolidative airspace disease. No pleural effusions. No pneumothorax. No pulmonary nodule or mass noted. Pulmonary vasculature and the cardiomediastinal silhouette are within normal limits. IMPRESSION: 1. Support apparatus, as above. Please consider advancement of the nasogastric tube approximately 13 cm for more optimal placement in the stomach. 2. Low lung volumes without radiographic evidence of acute cardiopulmonary disease. Electronically Signed   By: Trudie Reedaniel  Entrikin M.D.   On: 02/20/2019 20:14   Dg Abd 1 View  Result Date: 02/21/2019 CLINICAL DATA:  38 year old female with enteric tube placement. EXAM: ABDOMEN - 1 VIEW COMPARISON:  Abdominal radiograph dated 02/21/2019 FINDINGS: Partially visualized enteric tube with tip and side-port in the left upper abdomen likely within the stomach. There is no bowel dilatation. Large amount of stool noted within the colon. There is a 3 cm radiopaque density in the right upper quadrant, likely a gallstone. The osseous structures and soft tissues are grossly unremarkable. IMPRESSION: 1. Enteric tube with tip and side-port in the region of the stomach. 2. Probable gallstone. Electronically Signed   By: Elgie CollardArash  Radparvar M.D.   On: 02/21/2019 03:23   Dg Abd 1 View  Result Date: 02/21/2019 CLINICAL DATA:  Orogastric tube  placement EXAM: ABDOMEN - 1 VIEW COMPARISON:  None. FINDINGS: Side port of orogastric tube is at the gastroesophageal junction. Advancement by 7 cm  recommended. Normal bowel gas pattern. IMPRESSION: Orogastric tube side port at the gastroesophageal junction. Recommend advancing by 7 cm. Electronically Signed   By: Ulyses Jarred M.D.   On: 02/21/2019 01:41   Ct Head Wo Contrast  Result Date: 02/20/2019 CLINICAL DATA:  Possible overdose EXAM: CT HEAD WITHOUT CONTRAST TECHNIQUE: Contiguous axial images were obtained from the base of the skull through the vertex without intravenous contrast. COMPARISON:  None. FINDINGS: Brain: No evidence of acute infarction, hemorrhage, hydrocephalus, extra-axial collection or mass lesion/mass effect. Vascular: No hyperdense vessel or unexpected calcification. Skull: Normal. Negative for fracture or focal lesion. Sinuses/Orbits: No acute finding. Other: None. IMPRESSION: No acute intracranial pathology. Electronically Signed   By: Eddie Candle M.D.   On: 02/20/2019 21:13   Portable Chest Xray  Result Date: 02/21/2019 CLINICAL DATA:  Intubation EXAM: PORTABLE CHEST 1 VIEW COMPARISON:  02/20/2019 FINDINGS: Unchanged position of endotracheal tube with tip at the level of the clavicular heads. Orogastric tube side port is at the gastroesophageal junction. No focal airspace consolidation or pulmonary edema. IMPRESSION: Endotracheal tube tip at the level of the clavicular heads. Electronically Signed   By: Ulyses Jarred M.D.   On: 02/21/2019 01:42     ASSESSMENT AND PLAN:   1.  Drug overdose with Flexeril, Cymbalta, Neurontin Intubated to protect airway in the emergency room - Poison control contacted -  every 4 hours BMP - Every 6 hour EKG - Normal saline  - Psychiatric consultation once extubated --Suicide precautions -IVC in place  2.  History of EtOH abuse - CIWA protocol  3.  History of depression - Patient has been on Cymbalta according to documentation.  Medications are being held currently as patient is intubated and overdosed  DVT phylaxis with Lovenox and SCDs  All the records are reviewed and  case discussed with Care Management/Social Worker. Management plans discussed with the patient, family and they are in agreement.  CODE STATUS: FULL CODE  TOTAL TIME TAKING CARE OF THIS PATIENT: 30 minutes.   POSSIBLE D/C IN 2-3 DAYS, DEPENDING ON CLINICAL CONDITION.  Leia Alf Brantly Kalman M.D on 02/21/2019 at 12:20 PM  Between 7am to 6pm - Pager - [phone]  After 6pm go to [email or web address] - password [email or web address] [facility]  Office  [phone]  CC: Primary care physician; [facility]  Note: This dictation was prepared with Dragon dictation along with smaller phrase technology. Any transcriptional errors that result from this process are unintentional.

## 2019-02-22 DIAGNOSIS — G4709 Other insomnia: Secondary | ICD-10-CM

## 2019-02-22 DIAGNOSIS — F411 Generalized anxiety disorder: Secondary | ICD-10-CM

## 2019-02-22 LAB — GLUCOSE, CAPILLARY: Glucose-Capillary: 78 mg/dL (ref 70–99)

## 2019-02-22 LAB — HIV ANTIBODY (ROUTINE TESTING W REFLEX): HIV Screen 4th Generation wRfx: NONREACTIVE

## 2019-02-22 LAB — T3: T3, Total: 99 ng/dL (ref 71–180)

## 2019-02-22 LAB — T4: T4, Total: 6.9 ug/dL (ref 4.5–12.0)

## 2019-02-22 MED ORDER — TRAMADOL HCL 50 MG PO TABS
50.0000 mg | ORAL_TABLET | Freq: Once | ORAL | Status: DC
  Filled 2019-02-22 (×2): qty 1

## 2019-02-22 MED ORDER — HYDROCODONE-ACETAMINOPHEN 5-325 MG PO TABS
1.0000 | ORAL_TABLET | Freq: Once | ORAL | Status: AC
  Administered 2019-02-22: 1 via ORAL
  Filled 2019-02-22: qty 1

## 2019-02-22 MED ORDER — NICOTINE POLACRILEX 2 MG MT GUM
2.0000 mg | CHEWING_GUM | OROMUCOSAL | Status: DC | PRN
  Administered 2019-02-22 – 2019-02-23 (×6): 2 mg via ORAL
  Filled 2019-02-22 (×9): qty 1

## 2019-02-22 MED ORDER — POTASSIUM CHLORIDE CRYS ER 20 MEQ PO TBCR
40.0000 meq | EXTENDED_RELEASE_TABLET | Freq: Once | ORAL | Status: AC
  Administered 2019-02-22: 40 meq via ORAL
  Filled 2019-02-22: qty 2

## 2019-02-22 MED ORDER — NICOTINE POLACRILEX 2 MG MT GUM: 2.0000 mg | CHEWING_GUM | OROMUCOSAL | Status: DC | PRN

## 2019-02-22 MED ORDER — TRAMADOL HCL 50 MG PO TABS
50.0000 mg | ORAL_TABLET | ORAL | Status: AC
  Administered 2019-02-22: 50 mg via ORAL
  Filled 2019-02-22: qty 1

## 2019-02-22 NOTE — Discharge Summary (Addendum)
[place] [facility] [place] NAME: Toni Friedman    MR#:  [id number]  DATE OF BIRTH:  03/08/1981  DATE OF ADMISSION:  02/20/2019 ADMITTING PHYSICIAN: Christel Mormon, MD  DATE OF DISCHARGE: 02/24/2019  PRIMARY CARE PHYSICIAN: [place], [facility]    ADMISSION DIAGNOSIS:  Drug overdose, undetermined intent, initial encounter [T50.904A]  DISCHARGE DIAGNOSIS:  Active Problems:   Overdose   Severe episode of recurrent major depressive disorder, without psychotic features ([place])   SECONDARY DIAGNOSIS:   Past Medical History:  Diagnosis Date  . Anxiety   . Arthritis    knees  . Depression   . History of alcohol abuse     HOSPITAL COURSE:  38 year old female with a history of of depression who presented to the emergency room after suicidal attempt with overdose of Flexeril, Cymbalta and Neurontin.  1.  Acute hypoxic respiratory failure in the setting of drug overdose with numerous medications.  Patient was intubated to protect airway in the emergency room.  She has now been extubated.   Respiratory status is stable.  2.  Drug overdose with suicidal attempt: Patient was eval by psychiatry.  As per psychiatry recommendation she will need further treatment in inpatient psychiatric unit.  She did have IVC in place.  She also had one-to-one sitter.  3.  EtOH abuse: Patient was on CIWA protocol with uneventful detox.  4.  Depression: Management as per psychiatry. She will continue Cymbalta.   5.  Hypokalemia: This was repleted. 6. Tobacco dependence: Patient is encouraged to quit smoking and willing to attempt to quit was assessed. Patient highly motivated.Counseling was provided for 4 minutes.   DISCHARGE CONDITIONS AND DIET:  Stable Regular diet  CONSULTS OBTAINED:    DRUG ALLERGIES:  No Known Allergies  DISCHARGE MEDICATIONS:   Allergies as of 02/24/2019   No Known Allergies     Medication List    STOP taking these medications    cyclobenzaprine 10 MG tablet Commonly known as:  FLEXERIL   gabapentin 600 MG tablet Commonly known as:  NEURONTIN     TAKE these medications   ARIPiprazole 2 MG tablet Commonly known as:  ABILIFY Take 1 tablet (2 mg total) by mouth daily. Start taking on:  February 25, 2019   DULoxetine 60 MG capsule Commonly known as:  CYMBALTA Take 60 mg by mouth daily.   nicotine 21 mg/24hr patch Commonly known as:  NICODERM CQ - dosed in mg/24 hours Place 1 patch (21 mg total) onto the skin daily.   nicotine polacrilex 2 MG gum Commonly known as:  NICORETTE Take 1 each (2 mg total) by mouth as needed for smoking cessation.         Today   CHIEF COMPLAINT:  Doing ok denies SI this am   VITAL SIGNS:  Blood pressure 121/85, pulse 72, temperature 98.1 F (36.7 C), temperature source Oral, resp. rate 18, height 5\' 2"  (1.575 m), weight 79.4 kg, SpO2 100 %.   REVIEW OF SYSTEMS:  Review of Systems  Constitutional: Negative.  Negative for chills, fever and malaise/fatigue.  HENT: Negative.  Negative for ear discharge, ear pain, hearing loss, nosebleeds and sore throat.   Eyes: Negative.  Negative for blurred vision and pain.  Respiratory: Negative.  Negative for cough, hemoptysis, shortness of breath and wheezing.   Cardiovascular: Negative.  Negative for chest pain, palpitations and leg swelling.  Gastrointestinal: Negative.  Negative for abdominal pain, blood in stool, diarrhea, nausea and  vomiting.  Genitourinary: Negative.  Negative for dysuria.  Musculoskeletal: Negative.  Negative for back pain.  Skin: Negative.   Neurological: Negative for dizziness, tremors, speech change, focal weakness, seizures and headaches.  Endo/Heme/Allergies: Negative.  Does not bruise/bleed easily.  Psychiatric/Behavioral: Negative.  Negative for depression, hallucinations and suicidal ideas.     PHYSICAL EXAMINATION:  GENERAL:  38 y.o.-year-old patient lying in the bed with no acute distress.   NECK:  Supple, no jugular venous distention. No thyroid enlargement, no tenderness.  LUNGS: Normal breath sounds bilaterally, no wheezing, rales,rhonchi  No use of accessory muscles of respiration.  CARDIOVASCULAR: S1, S2 normal. No murmurs, rubs, or gallops.  ABDOMEN: Soft, non-tender, non-distended. Bowel sounds present. No organomegaly or mass.  EXTREMITIES: No pedal edema, cyanosis, or clubbing.  PSYCHIATRIC: The patient is alert and oriented x 3.  SKIN: No obvious rash, lesion, or ulcer.   DATA REVIEW:   CBC Recent Labs  Lab 02/21/19 0449  WBC 8.1  HGB 9.5*  HCT 29.5*  PLT 308    Chemistries  Recent Labs  Lab 02/20/19 2330 02/21/19 0449  02/21/19 1754  NA 139 139   < > 138  K 3.4* 3.8   < > 3.4*  CL 109 114*   < > 111  CO2 22 20*   < > 21*  GLUCOSE 90 109*   < > 89  BUN 10 10   < > 6  CREATININE 0.72 0.79   < > 0.72  CALCIUM 8.4* 7.1*   < > 7.3*  MG  --  2.0  --   --   AST 19  --   --   --   ALT 18  --   --   --   ALKPHOS 20*  --   --   --   BILITOT 0.9  --   --   --    < > = values in this interval not displayed.    Cardiac Enzymes Recent Labs  Lab 02/20/19 2330 02/21/19 0449 02/21/19 1109  TROPONINI <0.03 <0.03 <0.03    Microbiology Results  @MICRORSLT48 @  RADIOLOGY:  No results found.    Allergies as of 02/24/2019   No Known Allergies     Medication List    STOP taking these medications   cyclobenzaprine 10 MG tablet Commonly known as:  FLEXERIL   gabapentin 600 MG tablet Commonly known as:  NEURONTIN     TAKE these medications   ARIPiprazole 2 MG tablet Commonly known as:  ABILIFY Take 1 tablet (2 mg total) by mouth daily. Start taking on:  February 25, 2019   DULoxetine 60 MG capsule Commonly known as:  CYMBALTA Take 60 mg by mouth daily.   nicotine 21 mg/24hr patch Commonly known as:  NICODERM CQ - dosed in mg/24 hours Place 1 patch (21 mg total) onto the skin daily.   nicotine polacrilex 2 MG gum Commonly known as:   NICORETTE Take 1 each (2 mg total) by mouth as needed for smoking cessation.          Management plans discussed with the patient and she is in agreement. Stable for discharge  Patient should follow up with psych CODE STATUS:     Code Status Orders  (From admission, onward)         Start     Ordered   02/20/19 2225  Full code  Continuous     02/20/19 2231        Code Status  History    This patient has a current code status but no historical code status.      TOTAL TIME TAKING CARE OF THIS PATIENT: 38 minutes.    Note: This dictation was prepared with Dragon dictation along with smaller phrase technology. Any transcriptional errors that result from this process are unintentional.  Adrian SaranSital Floyce Bujak M.D on 02/24/2019 at 9:14 AM  Between 7am to 6pm - Pager - [phone] After 6pm go to [email or web address] - Social research officer, governmentpassword [email or web address]  [facility]  Office  [phone]  CC: Primary care physician; [place]

## 2019-02-22 NOTE — Progress Notes (Signed)
Per patients 1:1 sitters, Patient seeing and hearing, Grandmother, Mother, boyfriend and little girl in room. Patient endorses hearing her grandmother, but states the voices are comforting.

## 2019-02-22 NOTE — Consult Note (Signed)
[facility] Consult follow-up  Reason for Consult:  Overdose as suicide attempt Referring Physician: Dr. Leslye Peer Patient Identification: Toni Friedman MRN:  [id number] Principal Diagnosis: MDD/r/o BPAD Diagnosis:  Active Problems:   Overdose   Severe episode of recurrent major depressive disorder, without psychotic features ([place])   Total Time spent with patient: 35 minutes  Subjective: "I am feeling better."  Toni Friedman is a 38 y.o. female patient admitted after an overdose on Flexeril and gabapentin.    HPI from psychiatric intake interview:  Toni Friedman is a 38 y.o. divorced female patient admitted after an overdose on Flexeril and gabapentin. Her drug screen reflects the abuse of cannabis and of course the tricyclic structure of Flexeril.  She was found by her brother she did not leave a note or notify anyone that she had overdosed.  She has been extubated now her voice is soft and little raspy but she does answer some questions.  When asked her if she really intended to die with the overdose she begins to cry and elaborates that that was her intent.  When asked if she feels safe here she says she does feel safe here. She has no plans to harm herself here.  When asked if she has had prior suicide attempts she elaborates that she once thought of shooting herself, her mother, phone with her permission, states that in the past the patient did indeed try to shoot herself but apparently the bullet was the wrong caliber size for the gun she had access to and her self-harm attempt simply fizzled at that point but she, at the present time, has no access to firearms. Patient has a family history of depression, she herself has abused cannabis since she was a teen and has done so chronically but probably not daily according to her mother.  She also has lost her license due to a DUI charge.  After she divorced from her husband she drifted into alcoholism according to her mother  but she is not had anything to drink in over a year but she still does not have driving privileges and this seemed to trigger the overdose.  There was dispute with her mother about picking her up at a certain time and the mother was unaware of this, she sent the brother to check on her because she "did not want to get an argument".  And of course the brother had found her after an overdose. When I asked the patient why she was so stressed an overdose she begins crying and states "I do not want to talk about it" Toni Friedman, according to family members has a great deal of mood instability sometimes she is upbeat and sometimes she is angry and irritable for no reason and argumentative and at other times is of course dysphoric and depressed and the family tries to back off and "let her have her spells" and engage her when the spells wear off.  Her mother is familiar with depression and thinks this is beyond depression as the patient again has a great deal of instability in her mood and further she expresses not formed paranoid delusions but just the idea of the people are generally working against her.  She is also let her house chores go undone for prolonged period of time and her house is in disarray although her basic hygiene is intact.  In general the patient has capacity to be highly functioning she is worked a [employer] and is been a  very good employee and so forth.  On reevaluation today, 02/22/2019 patient is in bed, restless and agitated.  She is alert and oriented to person, place and situation.  She admits to suicide attempt, but is still not wanting to discuss the situation, other than stating "there are a lot of issues."  Patient states she has not had an outpatient psychiatrist or been in therapy.  She reports that she sees Dr. Nicki Reaper, who "takes good care of me."  Patient is tearful, but states, "I think I am ready to go.  I have to get back to work."  Patient is able to contract for safety while  hospitalized.  She continues to have passive suicidal thoughts.  She denies homicidal ideation or AVH.  Past Psychiatric History: Patient has been prescribed Cymbalta and Flexeril and gabapentin by her primary care provider, Dr. Dema Severin at [facility] primary care in [facility] but she has had no prior inpatient or outpatient therapy from a psychiatric standpoint.  Risk to Self:  High Risk to Others:  Negligible Prior Inpatient Therapy:  Negative Prior Outpatient Therapy:  As per primary care  Past Medical History:  Past Medical History:  Diagnosis Date  . Anxiety   . Arthritis    knees  . Depression   . History of alcohol abuse     Past Surgical History:  Procedure Laterality Date  . KNEE SURGERY Left 2000  . TIBIA IM NAIL INSERTION Right 12/19/2015   Procedure: INTRAMEDULLARY (IM) NAIL TIBIAL;  Surgeon: Hessie Knows, MD;  Location: [facility] ORS;  Service: Orthopedics;  Laterality: Right;  . TUBAL LIGATION    . WRIST SURGERY  2000   fracture   Family History:  Family History  Problem Relation Age of Onset  . Lupus Mother   . Diabetes Father   . Cancer Neg Hx   . COPD Neg Hx   . Heart disease Neg Hx   . Hypertension Neg Hx   . Stroke Neg Hx    Family Psychiatric  History: MDD in mother- mother side - mother take Cymblata   Social History:  Social History   Substance and Sexual Activity  Alcohol Use No   Comment: socially     Social History   Substance and Sexual Activity  Drug Use No    Social History   Socioeconomic History  . Marital status: Single    Spouse name: Not on file  . Number of children: Not on file  . Years of education: Not on file  . Highest education level: Not on file  Occupational History  . Not on file  Social Needs  . Financial resource strain: Not on file  . Food insecurity:    Worry: Not on file    Inability: Not on file  . Transportation needs:    Medical: Not on file    Non-medical: Not on file  Tobacco Use  . Smoking status: Former Smoker     Packs/day: 0.50  . Smokeless tobacco: Never Used  Substance and Sexual Activity  . Alcohol use: No    Comment: socially  . Drug use: No  . Sexual activity: Not on file  Lifestyle  . Physical activity:    Days per week: Not on file    Minutes per session: Not on file  . Stress: Not on file  Relationships  . Social connections:    Talks on phone: Not on file    Gets together: Not on file    Attends religious service:  Not on file    Active member of club or organization: Not on file    Attends meetings of clubs or organizations: Not on file    Relationship status: Not on file  Other Topics Concern  . Not on file  Social History Narrative  . Not on file   Additional Social History: Patient is divorced, she lives in the vicinity of family members, she has 1 DUI which resulted in the loss of her license.    Allergies:  No Known Allergies  Labs:  Results for orders placed or performed during the hospital encounter of 02/20/19 (from the past 48 hour(s))  Glucose, capillary     Status: None   Collection Time: 02/20/19 11:21 PM  Result Value Ref Range   Glucose-Capillary 80 70 - 99 mg/dL  MRSA PCR Screening     Status: None   Collection Time: 02/20/19 11:24 PM  Result Value Ref Range   MRSA by PCR NEGATIVE NEGATIVE    Comment:        The GeneXpert MRSA Assay (FDA approved for NASAL specimens only), is one component of a comprehensive MRSA colonization surveillance program. It is not intended to diagnose MRSA infection nor to guide or monitor treatment for MRSA infections. Performed at [facility], [address] [place], [place] [address]   Comprehensive metabolic panel     Status: Abnormal   Collection Time: 02/20/19 11:30 PM  Result Value Ref Range   Sodium 139 135 - 145 mmol/L   Potassium 3.4 (L) 3.5 - 5.1 mmol/L   Chloride 109 98 - 111 mmol/L   CO2 22 22 - 32 mmol/L   Glucose, Bld 90 70 - 99 mg/dL   BUN 10 6 - 20 mg/dL   Creatinine, Ser 0.72 0.44  - 1.00 mg/dL   Calcium 8.4 (L) 8.9 - 10.3 mg/dL   Total Protein 7.5 6.5 - 8.1 g/dL   Albumin 3.8 3.5 - 5.0 g/dL   AST 19 15 - 41 U/L   ALT 18 0 - 44 U/L   Alkaline Phosphatase 20 (L) 38 - 126 U/L   Total Bilirubin 0.9 0.3 - 1.2 mg/dL   GFR calc non Af Amer >60 >60 mL/min   GFR calc Af Amer >60 >60 mL/min   Anion gap 8 5 - 15    Comment: Performed at [facility], [address] [place], [place] [address]  Acetaminophen level     Status: Abnormal   Collection Time: 02/20/19 11:30 PM  Result Value Ref Range   Acetaminophen (Tylenol), Serum <10 (L) 10 - 30 ug/mL    Comment: (NOTE) Therapeutic concentrations vary significantly. A range of 10-30 ug/mL  may be an effective concentration for many patients. However, some  are best treated at concentrations outside of this range. Acetaminophen concentrations >150 ug/mL at 4 hours after ingestion  and >50 ug/mL at 12 hours after ingestion are often associated with  toxic reactions. Performed at [facility], [address] [place], [place] [address]   Troponin I - Once     Status: None   Collection Time: 02/20/19 11:30 PM  Result Value Ref Range   Troponin I <0.03 <0.03 ng/mL    Comment: Performed at [facility], [address] [place],  [address]  Salicylate level     Status: None   Collection Time: 02/20/19 11:30 PM  Result Value Ref Range   Salicylate Lvl <6.7 2.8 - 30.0 mg/dL    Comment: Performed  at [facility], [address] [place], [place] [address]  TSH     Status: Abnormal   Collection Time: 02/20/19 11:30 PM  Result Value Ref Range   TSH 4.939 (H) 0.350 - 4.500 uIU/mL    Comment: Performed by a 3rd Generation assay with a functional sensitivity of <=0.01 uIU/mL. Performed at [facility], [address] [place], [place] [address]   HIV antibody (Routine Testing)     Status: None   Collection Time: 02/20/19 11:30 PM  Result Value Ref Range   HIV Screen 4th  Generation wRfx Non Reactive Non Reactive    Comment: (NOTE) Performed At: [facility] [place], [place] [id number] Rush Farmer MD [id number]   CBC     Status: Abnormal   Collection Time: 02/20/19 11:30 PM  Result Value Ref Range   WBC 9.6 4.0 - 10.5 K/uL   RBC 3.85 (L) 3.87 - 5.11 MIL/uL   Hemoglobin 11.6 (L) 12.0 - 15.0 g/dL   HCT 35.0 (L) 36.0 - 46.0 %   MCV 90.9 80.0 - 100.0 fL   MCH 30.1 26.0 - 34.0 pg   MCHC 33.1 30.0 - 36.0 g/dL   RDW 13.4 11.5 - 15.5 %   Platelets 380 150 - 400 K/uL   nRBC 0.0 0.0 - 0.2 %    Comment: Performed at [facility], [address] [place], [place] [address]  Blood gas, arterial     Status: Abnormal   Collection Time: 02/21/19  4:36 AM  Result Value Ref Range   FIO2 0.30    Delivery systems VENTILATOR    Mode PRESSURE REGULATED VOLUME CONTROL    VT 470.0 mL   Peep/cpap 5.0 cm H20   pH, Arterial 7.37 7.350 - 7.450   pCO2 arterial 36 32.0 - 48.0 mmHg   pO2, Arterial 139 (H) 83.0 - 108.0 mmHg   Bicarbonate 20.8 20.0 - 28.0 mmol/L   Acid-base deficit 3.9 (H) 0.0 - 2.0 mmol/L   O2 Saturation 99.1 %   Patient temperature 37.0    Collection site LEFT RADIAL    Sample type ARTERIAL DRAW    Allens test (pass/fail) PASS PASS   Mechanical Rate 14     Comment: Performed at [facility], [address] [place], [place] [address]  CBC     Status: Abnormal   Collection Time: 02/21/19  4:49 AM  Result Value Ref Range   WBC 8.1 4.0 - 10.5 K/uL   RBC 3.17 (L) 3.87 - 5.11 MIL/uL   Hemoglobin 9.5 (L) 12.0 - 15.0 g/dL   HCT 29.5 (L) 36.0 - 46.0 %   MCV 93.1 80.0 - 100.0 fL   MCH 30.0 26.0 - 34.0 pg   MCHC 32.2 30.0 - 36.0 g/dL   RDW 13.4 11.5 - 15.5 %   Platelets 308 150 - 400 K/uL   nRBC 0.0 0.0 - 0.2 %    Comment: Performed at [facility], [address] [place], [place] [address]  Protime-INR     Status: None   Collection Time: 02/21/19  4:49 AM  Result Value Ref Range    Prothrombin Time 14.5 11.4 - 15.2 seconds   INR 1.1 0.8 - 1.2    Comment: (NOTE) INR goal varies based on device and disease states. Performed at [facility], [address] [place],  [address]   Basic metabolic panel     Status: Abnormal   Collection Time: 02/21/19  4:49  AM  Result Value Ref Range   Sodium 139 135 - 145 mmol/L   Potassium 3.8 3.5 - 5.1 mmol/L   Chloride 114 (H) 98 - 111 mmol/L   CO2 20 (L) 22 - 32 mmol/L   Glucose, Bld 109 (H) 70 - 99 mg/dL   BUN 10 6 - 20 mg/dL   Creatinine, Ser 0.79 0.44 - 1.00 mg/dL   Calcium 7.1 (L) 8.9 - 10.3 mg/dL   GFR calc non Af Amer >60 >60 mL/min   GFR calc Af Amer >60 >60 mL/min   Anion gap 5 5 - 15    Comment: Performed at [facility], [address] [place], [place] [address]  Troponin I - Now Then Q6H     Status: None   Collection Time: 02/21/19  4:49 AM  Result Value Ref Range   Troponin I <0.03 <0.03 ng/mL    Comment: Performed at [facility], [address] [place], [place] [address]  T3     Status: None   Collection Time: 02/21/19  4:49 AM  Result Value Ref Range   T3, Total 99 71 - 180 ng/dL    Comment: (NOTE) Performed At: [facility] [place], [place] [id number] Rush Farmer MD [id number]   T4     Status: None   Collection Time: 02/21/19  4:49 AM  Result Value Ref Range   T4, Total 6.9 4.5 - 12.0 ug/dL    Comment: (NOTE) Performed At: [facility] [place], [place] [id number] Rush Farmer MD [id number]   Magnesium     Status: None   Collection Time: 02/21/19  4:49 AM  Result Value Ref Range   Magnesium 2.0 1.7 - 2.4 mg/dL    Comment: Performed at [facility], [address] [place], [place] [address]  Phosphorus     Status: None   Collection Time: 02/21/19  4:49 AM  Result Value Ref Range   Phosphorus 3.2 2.5 - 4.6 mg/dL    Comment: Performed at [facility], [address]  [place], [place] [address]  Glucose, capillary     Status: Abnormal   Collection Time: 02/21/19  5:31 AM  Result Value Ref Range   Glucose-Capillary 107 (H) 70 - 99 mg/dL  Culture, respiratory (non-expectorated)     Status: None (Preliminary result)   Collection Time: 02/21/19  8:15 AM  Result Value Ref Range   Specimen Description      TRACHEAL ASPIRATE Performed at [facility], [address], [place], [place] [address]    Special Requests      NONE Performed at [facility], [address] [place], [place] [address]    Gram Stain      ABUNDANT WBC PRESENT, PREDOMINANTLY PMN MODERATE GRAM POSITIVE COCCI FEW GRAM POSITIVE RODS    Culture      CULTURE REINCUBATED FOR BETTER GROWTH Performed at [facility], [place] [address], [place], [place] [address]    Report Status PENDING   Troponin I - Now Then Q6H     Status: None   Collection Time: 02/21/19 11:09 AM  Result Value Ref Range   Troponin I <0.03 <0.03 ng/mL    Comment: Performed at [facility], [address] [place],  [address]  Basic metabolic panel     Status: Abnormal   Collection Time: 02/21/19 11:09 AM  Result Value Ref Range   Sodium 141 135 - 145 mmol/L  Potassium 3.7 3.5 - 5.1 mmol/L   Chloride 116 (H) 98 - 111 mmol/L   CO2 20 (L) 22 - 32 mmol/L   Glucose, Bld 97 70 - 99 mg/dL   BUN 8 6 - 20 mg/dL   Creatinine, Ser 0.78 0.44 - 1.00 mg/dL   Calcium 6.8 (L) 8.9 - 10.3 mg/dL   GFR calc non Af Amer >60 >60 mL/min   GFR calc Af Amer >60 >60 mL/min   Anion gap 5 5 - 15    Comment: Performed at [facility], [address] [place], [place] [address]  Glucose, capillary     Status: None   Collection Time: 02/21/19 11:52 AM  Result Value Ref Range   Glucose-Capillary 85 70 - 99 mg/dL  Glucose, capillary     Status: None   Collection Time: 02/21/19  5:16 PM  Result Value Ref Range   Glucose-Capillary 75 70 - 99 mg/dL  Basic metabolic panel     Status: Abnormal    Collection Time: 02/21/19  5:54 PM  Result Value Ref Range   Sodium 138 135 - 145 mmol/L   Potassium 3.4 (L) 3.5 - 5.1 mmol/L   Chloride 111 98 - 111 mmol/L   CO2 21 (L) 22 - 32 mmol/L   Glucose, Bld 89 70 - 99 mg/dL   BUN 6 6 - 20 mg/dL   Creatinine, Ser 0.72 0.44 - 1.00 mg/dL   Calcium 7.3 (L) 8.9 - 10.3 mg/dL   GFR calc non Af Amer >60 >60 mL/min   GFR calc Af Amer >60 >60 mL/min   Anion gap 6 5 - 15    Comment: Performed at [facility], [address] [place], [place] [address]  Glucose, capillary     Status: None   Collection Time: 02/22/19  5:09 PM  Result Value Ref Range   Glucose-Capillary 78 70 - 99 mg/dL    Current Facility-Administered Medications  Medication Dose Route Frequency Provider Last Rate Last Dose  . 0.9 %  sodium chloride infusion   Intravenous Continuous Seals, Briahna Pescador, NP 100 mL/hr at 02/22/19 1527    . ALPRAZolam (XANAX) tablet 1 mg  1 mg Oral TID PRN Flora Lipps, MD      . bisacodyl (DULCOLAX) suppository 10 mg  10 mg Rectal Daily PRN Tukov-Yual, Magdalene S, NP      . chlorhexidine gluconate (MEDLINE KIT) (PERIDEX) 0.12 % solution 15 mL  15 mL Mouth Rinse BID Tukov-Yual, Magdalene S, NP   15 mL at 02/22/19 2127  . Chlorhexidine Gluconate Cloth 2 % PADS 6 each  6 each Topical Q0600 Tukov-Yual, Magdalene S, NP   6 each at 02/21/19 0511  . DULoxetine (CYMBALTA) DR capsule 60 mg  60 mg Oral Daily Flora Lipps, MD   60 mg at 02/22/19 0933  . enoxaparin (LOVENOX) injection 40 mg  40 mg Subcutaneous Q24H Kasa, Kurian, MD      . folic acid (FOLVITE) tablet 1 mg  1 mg Oral Daily Flora Lipps, MD   1 mg at 02/22/19 0929  . ipratropium-albuterol (DUONEB) 0.5-2.5 (3) MG/3ML nebulizer solution 3 mL  3 mL Nebulization Q6H PRN Flora Lipps, MD      . LORazepam (ATIVAN) tablet 1 mg  1 mg Oral Q6H PRN Flora Lipps, MD       Or  . LORazepam (ATIVAN) injection 1 mg  1 mg Intravenous Q6H PRN Flora Lipps, MD      . multivitamin with minerals tablet  1 tablet   1 tablet Oral Daily Flora Lipps, MD   1 tablet at 02/22/19 [phone]  . nicotine (NICODERM CQ - dosed in mg/24 hours) patch 21 mg  21 mg Transdermal Daily Seals, Angela H, NP   21 mg at 02/22/19 0933  . nicotine polacrilex (NICORETTE) gum 2 mg  2 mg Oral PRN Mayer Camel, NP   2 mg at 02/22/19 2108  . ondansetron (ZOFRAN) tablet 4 mg  4 mg Oral Q6H PRN Seals, Theo Dills, NP       Or  . ondansetron (ZOFRAN) injection 4 mg  4 mg Intravenous Q6H PRN Seals, Angela H, NP      . pantoprazole (PROTONIX) EC tablet 40 mg  40 mg Oral Daily Flora Lipps, MD   40 mg at 02/22/19 0928  . pneumococcal 23 valent vaccine (PNU-IMMUNE) injection 0.5 mL  0.5 mL Intramuscular Tomorrow-1000 Vira Agar, Juan Quam, RN      . sennosides (SENOKOT) 8.8 MG/5ML syrup 5 mL  5 mL Per Tube BID PRN Tukov-Yual, Magdalene S, NP      . thiamine (VITAMIN B-1) tablet 100 mg  100 mg Oral Daily Flora Lipps, MD   100 mg at 02/22/19 0929  . traMADol (ULTRAM) tablet 50 mg  50 mg Oral Once Bettey Costa, MD        Musculoskeletal: Strength & Muscle Tone: decreased Gait & Station: unable to stand Patient leans: N/A  Psychiatric Specialty Exam: Physical Exam  Nursing note and vitals reviewed. Constitutional: She is oriented to person, place, and time. She appears well-nourished.  HENT:  Head: Normocephalic and atraumatic.  Cardiovascular: Regular rhythm.  Respiratory: No respiratory distress.  Musculoskeletal: Normal range of motion.  Neurological: She is alert and oriented to person, place, and time.     Review of Systems  Psychiatric/Behavioral: Positive for depression, substance abuse and suicidal ideas. The patient is nervous/anxious and has insomnia.   All other systems reviewed and are negative.  s/p O/D  Blood pressure 96/75, pulse 82, temperature 98.2 F (36.8 C), temperature source Oral, resp. rate 18, height '5\' 2"'  (1.575 m), weight 79.4 kg, SpO2 98 %.Body mass index is 32.02 kg/m.  General Appearance: Disheveled  Eye  Contact:  Fair  Speech:  Clear and Coherent  Volume:  Decreased  Mood:  Anxious and Depressed  Affect:  Congruent and Tearful  Thought Process:  Goal Directed and Descriptions of Associations: Intact  Orientation:  Other:  person/place/situation  Thought Content:  Hallucinations: None and Rumination  Suicidal Thoughts:  Yes.  with intent/plan  Homicidal Thoughts:  No  Memory:  Immediate;   Fair  Judgement:  Fair  Insight:  Present  Psychomotor Activity:  Decreased  Concentration:  Concentration: Good  Recall:  [employer] of Knowledge:  Fair  Language:  Fair  Akathisia:  Negative  Handed:  Right  AIMS (if indicated):     Assets:  [place] Talents/Skills  ADL's:  Impaired  Cognition:  Impaired,  Mild  Sleep:   Complaints of insomnia   In summary Toni Friedman is 11 she overdose with suicidal intent, she was found by family members.  She has a history of 1 DUI, she has cannabis dependency, and she is in need of inpatient stabilization.  She may have a bipolar type condition given the diversity of moods and level of agitation described by family.  Involuntary commitment paperwork completed  Treatment Plan Summary: Daily contact with patient to assess and evaluate symptoms  and progress in treatment and Medication management  Patient has been started on Cymbalta 60 mg daily for depression and anxiety Xanax 1 mg 3 times daily as needed is in order set along with CIWA protocol Patient requests nicotine replacement gum  Disposition: Recommend psychiatric Inpatient admission when medically cleared.  Patient will need to be discontinued from IV fluids stable vital signs and labs prior to psychiatric admission.  Lavella Hammock, MD 02/22/2019 10:00 PM

## 2019-02-23 ENCOUNTER — Inpatient Hospital Stay (HOSPITAL_COMMUNITY): Admission: AD | Admit: 2019-02-23 | Payer: Medicaid Other | Admitting: Psychiatry

## 2019-02-23 LAB — GLUCOSE, CAPILLARY
Glucose-Capillary: 100 mg/dL — ABNORMAL HIGH (ref 70–99)
Glucose-Capillary: 132 mg/dL — ABNORMAL HIGH (ref 70–99)
Glucose-Capillary: 81 mg/dL (ref 70–99)

## 2019-02-23 LAB — CULTURE, RESPIRATORY W GRAM STAIN: Culture: NORMAL

## 2019-02-23 MED ORDER — NICOTINE POLACRILEX 2 MG MT GUM: 2.0000 mg | CHEWING_GUM | OROMUCOSAL | 0 refills | Status: DC | PRN

## 2019-02-23 MED ORDER — ARIPIPRAZOLE 2 MG PO TABS
2.0000 mg | ORAL_TABLET | Freq: Every day | ORAL | Status: DC
  Administered 2019-02-23 – 2019-02-24 (×2): 2 mg via ORAL
  Filled 2019-02-23 (×2): qty 1

## 2019-02-23 MED ORDER — MORPHINE SULFATE (PF) 2 MG/ML IV SOLN
1.0000 mg | INTRAVENOUS | Status: DC | PRN
  Administered 2019-02-23 – 2019-02-24 (×6): 2 mg via INTRAVENOUS
  Filled 2019-02-23 (×6): qty 1

## 2019-02-23 MED ORDER — TRAMADOL HCL 50 MG PO TABS
50.0000 mg | ORAL_TABLET | Freq: Four times a day (QID) | ORAL | Status: DC | PRN
  Filled 2019-02-23: qty 1

## 2019-02-23 MED ORDER — PANTOPRAZOLE SODIUM 40 MG PO TBEC
40.0000 mg | DELAYED_RELEASE_TABLET | Freq: Two times a day (BID) | ORAL | Status: DC
  Administered 2019-02-23 – 2019-02-24 (×2): 40 mg via ORAL
  Filled 2019-02-23 (×2): qty 1

## 2019-02-23 MED ORDER — NICOTINE 21 MG/24HR TD PT24: 21.0000 mg | MEDICATED_PATCH | Freq: Every day | TRANSDERMAL | 0 refills | Status: DC

## 2019-02-23 NOTE — Consult Note (Signed)
[facility] Consult follow-up  Reason for Consult:  Overdose as suicide attempt Referring Physician: Dr. Leslye Peer Patient Identification: Toni Friedman MRN:  [id number] Principal Diagnosis: MDD/r/o BPAD Diagnosis:  Active Problems:   Overdose   Severe episode of recurrent major depressive disorder, without psychotic features ([place])  Patient is seen, chart is reviewed, greater than half of visit spent on counseling using cognitive behavioral therapy techniques to reverse cognitive distortions. Total Time spent with patient: 70 minutes  Subjective: "I was afraid I was going to lose everyone, now I am so embarrassed.  What if 1 of my children had done this?  I would feel so bad, and now I have put them through this."  HPI Toni Friedman is a 38 y.o. female patient admitted after an overdose on Flexeril and gabapentin.     from initial psychiatric intake interview:  Toni Friedman is a 38 y.o. divorced female patient admitted after an overdose on Flexeril and gabapentin. Her drug screen reflects the abuse of cannabis and of course the tricyclic structure of Flexeril.  She was found by her brother she did not leave a note or notify anyone that she had overdosed.  She has been extubated now her voice is soft and little raspy but she does answer some questions.  When asked her if she really intended to die with the overdose she begins to cry and elaborates that that was her intent.  When asked if she feels safe here she says she does feel safe here. She has no plans to harm herself here.  When asked if she has had prior suicide attempts she elaborates that she once thought of shooting herself, her mother, phone with her permission, states that in the past the patient did indeed try to shoot herself but apparently the bullet was the wrong caliber size for the gun she had access to and her self-harm attempt simply fizzled at that point but she, at the present time, has no access to  firearms. Patient has a family history of depression, she herself has abused cannabis since she was a teen and has done so chronically but probably not daily according to her mother.  She also has lost her license due to a DUI charge.  After she divorced from her husband she drifted into alcoholism according to her mother but she is not had anything to drink in over a year but she still does not have driving privileges and this seemed to trigger the overdose.  There was dispute with her mother about picking her up at a certain time and the mother was unaware of this, she sent the brother to check on her because she "did not want to get an argument".  And of course the brother had found her after an overdose. When I asked the patient why she was so stressed an overdose she begins crying and states "I do not want to talk about it" Toni Friedman, according to family members has a great deal of mood instability sometimes she is upbeat and sometimes she is angry and irritable for no reason and argumentative and at other times is of course dysphoric and depressed and the family tries to back off and "let her have her spells" and engage her when the spells wear off.  Her mother is familiar with depression and thinks this is beyond depression as the patient again has a great deal of instability in her mood and further she expresses not formed paranoid delusions but just  the idea of the people are generally working against her.  She is also let her house chores go undone for prolonged period of time and her house is in disarray although her basic hygiene is intact.  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  On reevaluation today, 02/23/2019 patient is in bed, restless and agitated.  She is alert and oriented to person, place and situation.  Patient is crying.  She describes some of the current stressors of her brother having  mental illness and addiction when she is trying to help him, her son and his girlfriend are pregnant and she is trying to help them.  She states she just got a vehicle so that her mother does not have to drive everybody everywhere, as that has been a stress for her.  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  She endorses guilt that she has not been able to take care of everybody, and now she has made them all be worried about her.  Patient reports poor concentration, insomnia, low energy, guilt and psychomotor agitation.  She states, "I do not know what I am going to do."  Implement cognitive behavioral therapy techniques to change cognitive distortions. She is able to identify her strengths.  She states she finds inspiration from a television program she listens to every morning at 4 AM in which they ask, who am I?;  What do I want?;  What do I want to be? Patient replies she is a mom, daughter, girlfriend, she wants family, health and happiness; she wants to have a good career.  Patient is able to make specific goals of having a good career means. Patient is able to contract for safety while hospitalized.  She continues to have passive suicidal thoughts.  She denies homicidal ideation or AVH. Patient reports that she has tolerated Cymbalta without any side effects.  She is agreeable to depression medication augmentation, Abilify which can also work his mood stabilization.  Past Psychiatric History: Patient has been prescribed Cymbalta and Flexeril and gabapentin by her primary care provider, Dr. Dema Severin at [facility] primary care in [facility] but she has had no prior inpatient or outpatient therapy from a psychiatric standpoint.  Risk to Self:  High Risk to Others:  Negligible Prior Inpatient Therapy:  Negative Prior Outpatient Therapy:  As per primary care, Dr. Dema Severin.  Patient has not had outpatient psychiatrist or therapist in the past.  Past Medical History:  Past Medical  History:  Diagnosis Date  . Anxiety   . Arthritis    knees  . Depression   . History of alcohol abuse     Past Surgical History:  Procedure Laterality Date  . KNEE SURGERY Left 2000  . TIBIA IM NAIL INSERTION Right 12/19/2015   Procedure: INTRAMEDULLARY (IM) NAIL TIBIAL;  Surgeon: Hessie Knows, MD;  Location: [facility] ORS;  Service: Orthopedics;  Laterality: Right;  . TUBAL LIGATION    . WRIST SURGERY  2000   fracture   Family History:  Family History  Problem Relation Age of Onset  . Lupus Mother   . Diabetes Father   . Cancer Neg Hx   . COPD Neg Hx   . Heart disease Neg Hx   . Hypertension Neg Hx   . Stroke Neg Hx    Family Psychiatric  History: MDD in mother- mother  side - mother take Cymbalta   Social History:  Social History   Substance and Sexual Activity  Alcohol Use No   Comment: socially     Social History   Substance and Sexual Activity  Drug Use No    Social History   Socioeconomic History  . Marital status: Single    Spouse name: Not on file  . Number of children: Not on file  . Years of education: Not on file  . Highest education level: Not on file  Occupational History  . Not on file  Social Needs  . Financial resource strain: Not on file  . Food insecurity:    Worry: Not on file    Inability: Not on file  . Transportation needs:    Medical: Not on file    Non-medical: Not on file  Tobacco Use  . Smoking status: Former Smoker    Packs/day: 0.50  . Smokeless tobacco: Never Used  Substance and Sexual Activity  . Alcohol use: No    Comment: socially  . Drug use: No  . Sexual activity: Not on file  Lifestyle  . Physical activity:    Days per week: Not on file    Minutes per session: Not on file  . Stress: Not on file  Relationships  . Social connections:    Talks on phone: Not on file    Gets together: Not on file    Attends religious service: Not on file    Active member of club or organization: Not on file    Attends meetings of  clubs or organizations: Not on file    Relationship status: Not on file  Other Topics Concern  . Not on file  Social History Narrative  . Not on file   Additional Social History: Patient is divorced, she lives in the vicinity of family members, she has 1 DUI which resulted in the loss of her license.    Allergies:  No Known Allergies  Labs:  Results for orders placed or performed during the hospital encounter of 02/20/19 (from the past 48 hour(s))  Glucose, capillary     Status: None   Collection Time: 02/22/19  5:09 PM  Result Value Ref Range   Glucose-Capillary 78 70 - 99 mg/dL  Glucose, capillary     Status: Abnormal   Collection Time: 02/23/19 12:02 AM  Result Value Ref Range   Glucose-Capillary 132 (H) 70 - 99 mg/dL  Glucose, capillary     Status: Abnormal   Collection Time: 02/23/19  5:43 AM  Result Value Ref Range   Glucose-Capillary 100 (H) 70 - 99 mg/dL  Glucose, capillary     Status: None   Collection Time: 02/23/19 11:40 AM  Result Value Ref Range   Glucose-Capillary 81 70 - 99 mg/dL   Comment 1 Notify RN    Comment 2 Document in Chart     Current Facility-Administered Medications  Medication Dose Route Frequency Provider Last Rate Last Dose  . ALPRAZolam Duanne Moron) tablet 1 mg  1 mg Oral TID PRN Flora Lipps, MD      . ARIPiprazole (ABILIFY) tablet 2 mg  2 mg Oral Daily Lavella Hammock, MD   2 mg at 02/23/19 1620  . bisacodyl (DULCOLAX) suppository 10 mg  10 mg Rectal Daily PRN Tukov-Yual, Magdalene S, NP      . chlorhexidine gluconate (MEDLINE KIT) (PERIDEX) 0.12 % solution 15 mL  15 mL Mouth Rinse BID Tukov-Yual, Magdalene S, NP   15 mL  at 02/23/19 0803  . Chlorhexidine Gluconate Cloth 2 % PADS 6 each  6 each Topical Q0600 Tukov-Yual, Magdalene S, NP   6 each at 02/21/19 0511  . DULoxetine (CYMBALTA) DR capsule 60 mg  60 mg Oral Daily Flora Lipps, MD   60 mg at 02/23/19 0940  . enoxaparin (LOVENOX) injection 40 mg  40 mg Subcutaneous Q24H Kasa, Kurian, MD       . folic acid (FOLVITE) tablet 1 mg  1 mg Oral Daily Flora Lipps, MD   1 mg at 02/23/19 0931  . ipratropium-albuterol (DUONEB) 0.5-2.5 (3) MG/3ML nebulizer solution 3 mL  3 mL Nebulization Q6H PRN Flora Lipps, MD      . LORazepam (ATIVAN) tablet 1 mg  1 mg Oral Q6H PRN Flora Lipps, MD       Or  . LORazepam (ATIVAN) injection 1 mg  1 mg Intravenous Q6H PRN Flora Lipps, MD      . morphine 2 MG/ML injection 1-2 mg  1-2 mg Intravenous Q4H PRN Mansy, Jan A, MD   2 mg at 02/23/19 1439  . multivitamin with minerals tablet 1 tablet  1 tablet Oral Daily Flora Lipps, MD   1 tablet at 02/23/19 0931  . nicotine (NICODERM CQ - dosed in mg/24 hours) patch 21 mg  21 mg Transdermal Daily Seals, Angela H, NP   21 mg at 02/23/19 0941  . nicotine polacrilex (NICORETTE) gum 2 mg  2 mg Oral PRN Mayer Camel, NP   2 mg at 02/23/19 1809  . ondansetron (ZOFRAN) tablet 4 mg  4 mg Oral Q6H PRN Seals, Theo Dills, NP       Or  . ondansetron (ZOFRAN) injection 4 mg  4 mg Intravenous Q6H PRN Seals, Theo Dills, NP      . pantoprazole (PROTONIX) EC tablet 40 mg  40 mg Oral BID AC Lavella Hammock, MD   40 mg at 02/23/19 1620  . pneumococcal 23 valent vaccine (PNU-IMMUNE) injection 0.5 mL  0.5 mL Intramuscular Tomorrow-1000 Vira Agar, Juan Quam, RN      . sennosides (SENOKOT) 8.8 MG/5ML syrup 5 mL  5 mL Per Tube BID PRN Tukov-Yual, Magdalene S, NP      . thiamine (VITAMIN B-1) tablet 100 mg  100 mg Oral Daily Flora Lipps, MD   100 mg at 02/23/19 0931  . traMADol (ULTRAM) tablet 50 mg  50 mg Oral Once Bettey Costa, MD      . traMADol (ULTRAM) tablet 50 mg  50 mg Oral Q6H PRN Bettey Costa, MD        Musculoskeletal: Strength & Muscle Tone: decreased Gait & Station: unable to stand Patient leans: N/A  Psychiatric Specialty Exam: Physical Exam  Nursing note and vitals reviewed. Constitutional: She is oriented to person, place, and time. She appears well-nourished.  HENT:  Head: Normocephalic and atraumatic.   Cardiovascular: Regular rhythm.  Respiratory: No respiratory distress.  Musculoskeletal: Normal range of motion.  Neurological: She is alert and oriented to person, place, and time.     Review of Systems  Psychiatric/Behavioral: Positive for depression, substance abuse and suicidal ideas. The patient is nervous/anxious and has insomnia.   All other systems reviewed and are negative.  s/p O/D  Blood pressure 119/80, pulse 83, temperature 98.1 F (36.7 C), temperature source Oral, resp. rate 18, height '5\' 2"'  (1.575 m), weight 79.4 kg, SpO2 100 %.Body mass index is 32.02 kg/m.  General Appearance: Disheveled  Eye Contact:  Fair  Speech:  Clear and Coherent  Volume:  Decreased  Mood:  Anxious and Depressed  Affect:  Congruent and Tearful  Thought Process:  Goal Directed and Descriptions of Associations: Intact  Orientation:  Other:  person/place/situation  Thought Content:  Hallucinations: None and Rumination  Suicidal Thoughts:  Yes.  with intent/plan  Homicidal Thoughts:  No  Memory:  Immediate;   Fair  Judgement:  Fair  Insight:  Present  Psychomotor Activity:  Decreased  Concentration:  Concentration: Good  Recall:  [employer] of Knowledge:  Fair  Language:  Fair  Akathisia:  Negative  Handed:  Right  AIMS (if indicated):     Assets:  [place] Talents/Skills  ADL's:  Impaired  Cognition:  Impaired,  Mild  Sleep:   Complaints of insomnia   In summary Ms. Janvrin is 11 she overdose with suicidal intent, she was found by family members.  She has a history of 1 DUI, she has cannabis dependency, and she is in need of inpatient stabilization.  She may have a bipolar type condition given the diversity of moods and level of agitation described by family.  Involuntary commitment paperwork completed  Treatment Plan Summary: Daily contact with patient to assess and evaluate symptoms and progress in treatment and Medication management  Continue on  Cymbalta 60 mg daily for depression and anxiety Add Abilify 2 mg daily for depression augmentation. Xanax 1 mg 3 times daily as needed is in order set along with CIWA protocol Patient requests nicotine replacement gum  Disposition: Recommend psychiatric Inpatient admission when medically cleared.    Lavella Hammock, MD 02/23/2019 7:20 PM

## 2019-02-23 NOTE — TOC Progression Note (Signed)
Transition of Care [facility]) - Progression Note    Patient Details  Name: SINIYA LICHTY MRN: [id number] Date of Birth: 1981-07-23  Transition of Care [facility]) CM/SW Contact  Shannel Zahm, [place]onia Beets, [place] Phone Number: 02/23/2019, 2:46 PM  Clinical Narrative:   Per RN the inpatient behavioral unit at [facility] is full. Clinical Education officer, museum (CSW) contacted the behavioral health hospital [facility]) in [place] and spoke to [place] with TTS and asked her to review chart for admission. Per Deidra she will review chart with [facility] and get back to [place]. CSW faxed IVC paper work to [facility] as requested. The psychiatrist is recommending patient go to an inpatient psych unit. CSW will continue to follow and assist as needed.             Expected Discharge Plan and Services            Expected Discharge Date: 02/23/19                                     Social Determinants of Health (SDOH) Interventions    Readmission Risk Interventions No flowsheet data found.

## 2019-02-23 NOTE — Progress Notes (Signed)
[facility] NAME: Toni Friedman    MR#:  [id number]  DATE OF BIRTH:  12/31/1980  SUBJECTIVE:  Langley Gauss SI  REVIEW OF SYSTEMS:    Review of Systems  Constitutional: Negative for fever, chills weight loss HENT: Negative for ear pain, nosebleeds, congestion, facial swelling, rhinorrhea, neck pain, neck stiffness and ear discharge.   Respiratory: Negative for cough, shortness of breath, wheezing  Cardiovascular: Negative for chest pain, palpitations and leg swelling.  Gastrointestinal: Negative for heartburn, abdominal pain, vomiting, diarrhea or consitpation Genitourinary: Negative for dysuria, urgency, frequency, hematuria Musculoskeletal: Negative for back pain or joint pain Neurological: Negative for dizziness, seizures, syncope, focal weakness,  numbness and headaches.  Hematological: Does not bruise/bleed easily.  Psychiatric/Behavioral: Negative for hallucinations, confusion, ++ dysphoric mood    Tolerating Diet: yes      DRUG ALLERGIES:  No Known Allergies  VITALS:  Blood pressure 113/86, pulse 76, temperature (!) 97.5 F (36.4 C), temperature source Oral, resp. rate 20, height 5\' 2"  (1.575 m), weight 79.4 kg, SpO2 99 %.  PHYSICAL EXAMINATION:  Constitutional: Appears well-developed and well-nourished. No distress. HENT: Normocephalic. Marland Kitchen Oropharynx is clear and moist.  Eyes: Conjunctivae and EOM are normal. PERRLA, no scleral icterus.  Neck: Normal ROM. Neck supple. No JVD. No tracheal deviation. CVS: RRR, S1/S2 +, no murmurs, no gallops, no carotid bruit.  Pulmonary: Effort and breath sounds normal, no stridor, rhonchi, wheezes, rales.  Abdominal: Soft. BS +,  no distension, tenderness, rebound or guarding.  Musculoskeletal: Normal range of motion. No edema and no tenderness.  Neuro: Alert. CN 2-12 grossly intact. No focal deficits. Skin: Skin is warm and dry. No rash noted. Psychiatric: Normal mood and affect.       LABORATORY PANEL:   CBC Recent Labs  Lab 02/21/19 0449  WBC 8.1  HGB 9.5*  HCT 29.5*  PLT 308   ------------------------------------------------------------------------------------------------------------------  Chemistries  Recent Labs  Lab 02/20/19 2330 02/21/19 0449  02/21/19 1754  NA 139 139   < > 138  K 3.4* 3.8   < > 3.4*  CL 109 114*   < > 111  CO2 22 20*   < > 21*  GLUCOSE 90 109*   < > 89  BUN 10 10   < > 6  CREATININE 0.72 0.79   < > 0.72  CALCIUM 8.4* 7.1*   < > 7.3*  MG  --  2.0  --   --   AST 19  --   --   --   ALT 18  --   --   --   ALKPHOS 20*  --   --   --   BILITOT 0.9  --   --   --    < > = values in this interval not displayed.   ------------------------------------------------------------------------------------------------------------------  Cardiac Enzymes Recent Labs  Lab 02/20/19 2330 02/21/19 0449 02/21/19 1109  TROPONINI <0.03 <0.03 <0.03   ------------------------------------------------------------------------------------------------------------------  RADIOLOGY:  No results found.   ASSESSMENT AND PLAN:   38 year old female with a history of of depression who presented to the emergency room after suicidal attempt with overdose of Flexeril, Cymbalta and Neurontin.  1.  Acute hypoxic respiratory failure in the setting of drug overdose with numerous medications.  Patient was intubated to protect airway in the emergency room.  She has now been extubated.   Respiratory status is stable.  2.  Drug overdose with suicidal attempt: Patient was eval by psychiatry.  As per psychiatry recommendation she will need further treatment in inpatient psychiatric unit.  She did have IVC in place.  She also had one-to-one sitter.  3.  EtOH abuse: Patient was on CIWA protocol with uneventful detox.  4.  Depression: Management as per psychiatry.  5.  Hypokalemia: This was repleted.      Management plans discussed with the patient  and she is in agreement.  CODE STATUS: full  TOTAL TIME TAKING CARE OF THIS PATIENT: 25 minutes.     POSSIBLE D/C today medically stable for transfer to inpatient PSYCHIATRY, DEPENDING ON CLINICAL CONDITION.   Adrian SaranSital Joyclyn Plazola M.D on 02/23/2019 at 9:11 AM  Between 7am to 6pm - Pager - [phone] After 6pm go to [email or web address] - password [email or web address]  [facility] [place] [facility]  Office  [phone]  CC: Primary care physician; Jerrilyn [place], [facility]  Note: This dictation was prepared with Dragon dictation along with smaller phrase technology. Any transcriptional errors that result from this process are unintentional.

## 2019-02-24 ENCOUNTER — Inpatient Hospital Stay
Admission: AD | Admit: 2019-02-24 | Discharge: 2019-02-26 | DRG: 885 | Disposition: A | Discharge status: Home / Self Care | Payer: Medicaid Other | Attending: Psychiatry | Admitting: Psychiatry

## 2019-02-24 ENCOUNTER — Other Ambulatory Visit: Payer: Self-pay

## 2019-02-24 DIAGNOSIS — G894 Chronic pain syndrome: Secondary | ICD-10-CM

## 2019-02-24 DIAGNOSIS — F1210 Cannabis abuse, uncomplicated: Secondary | ICD-10-CM

## 2019-02-24 DIAGNOSIS — Z818 Family history of other mental and behavioral disorders: Secondary | ICD-10-CM

## 2019-02-24 DIAGNOSIS — Z87891 Personal history of nicotine dependence: Secondary | ICD-10-CM | POA: Diagnosis not present

## 2019-02-24 DIAGNOSIS — G4700 Insomnia, unspecified: Secondary | ICD-10-CM | POA: Diagnosis present

## 2019-02-24 DIAGNOSIS — R45851 Suicidal ideations: Secondary | ICD-10-CM | POA: Diagnosis present

## 2019-02-24 DIAGNOSIS — Z79899 Other long term (current) drug therapy: Secondary | ICD-10-CM

## 2019-02-24 DIAGNOSIS — M797 Fibromyalgia: Secondary | ICD-10-CM | POA: Diagnosis present

## 2019-02-24 DIAGNOSIS — K219 Gastro-esophageal reflux disease without esophagitis: Secondary | ICD-10-CM | POA: Diagnosis present

## 2019-02-24 DIAGNOSIS — T1491XA Suicide attempt, initial encounter: Secondary | ICD-10-CM | POA: Diagnosis present

## 2019-02-24 DIAGNOSIS — F332 Major depressive disorder, recurrent severe without psychotic features: Principal | ICD-10-CM | POA: Diagnosis present

## 2019-02-24 LAB — GLUCOSE, CAPILLARY
Glucose-Capillary: 127 mg/dL — ABNORMAL HIGH (ref 70–99)
Glucose-Capillary: 82 mg/dL (ref 70–99)

## 2019-02-24 MED ORDER — ARIPIPRAZOLE 2 MG PO TABS: 2.0000 mg | ORAL_TABLET | Freq: Every day | ORAL | 0 refills | Status: DC

## 2019-02-24 MED ORDER — LORAZEPAM 2 MG/ML IJ SOLN: 1.0000 mg | Freq: Four times a day (QID) | INTRAMUSCULAR | Status: DC | PRN

## 2019-02-24 MED ORDER — ACETAMINOPHEN 325 MG PO TABS
650.0000 mg | ORAL_TABLET | Freq: Four times a day (QID) | ORAL | Status: DC | PRN
  Administered 2019-02-24 – 2019-02-26 (×4): 650 mg via ORAL
  Filled 2019-02-24 (×4): qty 2

## 2019-02-24 MED ORDER — BISACODYL 10 MG RE SUPP
10.0000 mg | Freq: Every day | RECTAL | Status: DC | PRN
  Filled 2019-02-24: qty 1

## 2019-02-24 MED ORDER — CHLORHEXIDINE GLUCONATE CLOTH 2 % EX PADS: 6.0000 | MEDICATED_PAD | Freq: Every day | CUTANEOUS | Status: DC

## 2019-02-24 MED ORDER — NICOTINE 21 MG/24HR TD PT24
21.0000 mg | MEDICATED_PATCH | Freq: Every day | TRANSDERMAL | Status: DC
  Administered 2019-02-25 – 2019-02-26 (×2): 21 mg via TRANSDERMAL
  Filled 2019-02-24 (×2): qty 1

## 2019-02-24 MED ORDER — NICOTINE POLACRILEX 2 MG MT GUM: 2.0000 mg | CHEWING_GUM | OROMUCOSAL | Status: DC | PRN

## 2019-02-24 MED ORDER — CHLORHEXIDINE GLUCONATE 0.12% ORAL RINSE (MEDLINE KIT): 15.0000 mL | Freq: Two times a day (BID) | OROMUCOSAL | Status: DC

## 2019-02-24 MED ORDER — SENNOSIDES 8.8 MG/5ML PO SYRP
5.0000 mL | ORAL_SOLUTION | Freq: Two times a day (BID) | ORAL | Status: DC | PRN
  Filled 2019-02-24: qty 5

## 2019-02-24 MED ORDER — LORAZEPAM 1 MG PO TABS: 1.0000 mg | ORAL_TABLET | ORAL | Status: DC | PRN

## 2019-02-24 MED ORDER — LORAZEPAM 1 MG PO TABS: 1.0000 mg | ORAL_TABLET | Freq: Four times a day (QID) | ORAL | Status: DC | PRN

## 2019-02-24 MED ORDER — ONDANSETRON HCL 4 MG PO TABS: 4.0000 mg | ORAL_TABLET | Freq: Four times a day (QID) | ORAL | Status: DC | PRN

## 2019-02-24 MED ORDER — ARIPIPRAZOLE 5 MG PO TABS
5.0000 mg | ORAL_TABLET | Freq: Every day | ORAL | Status: DC
  Administered 2019-02-25 – 2019-02-26 (×2): 5 mg via ORAL
  Filled 2019-02-24 (×2): qty 1

## 2019-02-24 MED ORDER — GABAPENTIN 300 MG PO CAPS
300.0000 mg | ORAL_CAPSULE | Freq: Three times a day (TID) | ORAL | Status: DC
  Administered 2019-02-24 – 2019-02-26 (×5): 300 mg via ORAL
  Filled 2019-02-24 (×5): qty 1

## 2019-02-24 MED ORDER — ARIPIPRAZOLE 2 MG PO TABS: 2.0000 mg | ORAL_TABLET | Freq: Every day | ORAL | Status: DC

## 2019-02-24 MED ORDER — DULOXETINE HCL 30 MG PO CPEP: 60.0000 mg | ORAL_CAPSULE | Freq: Every day | ORAL | Status: DC

## 2019-02-24 MED ORDER — HYDROXYZINE HCL 25 MG PO TABS: 25.0000 mg | ORAL_TABLET | ORAL | Status: DC | PRN

## 2019-02-24 MED ORDER — ZIPRASIDONE MESYLATE 20 MG IM SOLR: 20.0000 mg | INTRAMUSCULAR | Status: DC | PRN

## 2019-02-24 MED ORDER — VITAMIN B-1 100 MG PO TABS
100.0000 mg | ORAL_TABLET | Freq: Every day | ORAL | Status: DC
  Administered 2019-02-25 – 2019-02-26 (×2): 100 mg via ORAL
  Filled 2019-02-24 (×2): qty 1

## 2019-02-24 MED ORDER — ALPRAZOLAM 0.5 MG PO TABS: 1.0000 mg | ORAL_TABLET | Freq: Three times a day (TID) | ORAL | Status: DC | PRN

## 2019-02-24 MED ORDER — FOLIC ACID 1 MG PO TABS
1.0000 mg | ORAL_TABLET | Freq: Every day | ORAL | Status: DC
  Administered 2019-02-25 – 2019-02-26 (×2): 1 mg via ORAL
  Filled 2019-02-24 (×2): qty 1

## 2019-02-24 MED ORDER — RISPERIDONE 1 MG PO TBDP
2.0000 mg | ORAL_TABLET | Freq: Three times a day (TID) | ORAL | Status: DC | PRN
  Filled 2019-02-24: qty 2

## 2019-02-24 MED ORDER — DULOXETINE HCL 30 MG PO CPEP
90.0000 mg | ORAL_CAPSULE | Freq: Every day | ORAL | Status: DC
  Administered 2019-02-25 – 2019-02-26 (×2): 90 mg via ORAL
  Filled 2019-02-24 (×2): qty 3

## 2019-02-24 MED ORDER — IPRATROPIUM-ALBUTEROL 0.5-2.5 (3) MG/3ML IN SOLN: 3.0000 mL | Freq: Four times a day (QID) | RESPIRATORY_TRACT | Status: DC | PRN

## 2019-02-24 MED ORDER — TRAMADOL HCL 50 MG PO TABS: 50.0000 mg | ORAL_TABLET | Freq: Four times a day (QID) | ORAL | Status: DC | PRN

## 2019-02-24 MED ORDER — ONDANSETRON HCL 4 MG/2ML IJ SOLN
4.0000 mg | Freq: Four times a day (QID) | INTRAMUSCULAR | Status: DC | PRN
  Filled 2019-02-24: qty 2

## 2019-02-24 MED ORDER — PANTOPRAZOLE SODIUM 40 MG PO TBEC
40.0000 mg | DELAYED_RELEASE_TABLET | Freq: Two times a day (BID) | ORAL | Status: DC
  Administered 2019-02-24 – 2019-02-26 (×4): 40 mg via ORAL
  Filled 2019-02-24 (×4): qty 1

## 2019-02-24 MED ORDER — MAGNESIUM HYDROXIDE 400 MG/5ML PO SUSP: 30.0000 mL | Freq: Every day | ORAL | Status: DC | PRN

## 2019-02-24 MED ORDER — IBUPROFEN 600 MG PO TABS: 600.0000 mg | ORAL_TABLET | Freq: Four times a day (QID) | ORAL | Status: DC | PRN

## 2019-02-24 MED ORDER — ADULT MULTIVITAMIN W/MINERALS CH
1.0000 | ORAL_TABLET | Freq: Every day | ORAL | Status: DC
  Administered 2019-02-25 – 2019-02-26 (×2): 1 via ORAL
  Filled 2019-02-24 (×2): qty 1

## 2019-02-24 MED ORDER — ALUM & MAG HYDROXIDE-SIMETH 200-200-20 MG/5ML PO SUSP
30.0000 mL | ORAL | Status: DC | PRN
  Administered 2019-02-26: 30 mL via ORAL
  Filled 2019-02-24: qty 30
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
## 2019-02-24 NOTE — Plan of Care (Signed)
  Problem: Education: Goal: Knowledge of [facility] Education information/materials will improve Note:  Patient instructed  on [place] Education , unit programing , able to verbalize understanding

## 2019-02-24 NOTE — Progress Notes (Signed)
Pt discharged to behavioral medicine [facility]. IV removed. VSS. Pt in no acute distress but is agitated that she cannot receive pain medication for her chronic pain. Report called to Behavioral medicine RN. Pt wheeled down to behavioral medicine with security escort.

## 2019-02-24 NOTE — BH Assessment (Addendum)
Patient has been accepted to [facility].  Accepting physician is Dr. Leverne Humbles.  Attending Physician will be Dr. Weber Cooks.  Patient has been assigned to room 306, by [place].  Call report to [phone].  Representative/Transfer Coordinator is Print production planner (TTS) Patient pre-admitted by [facility] Patient Access Chiropodist)   This Probation officer informed pt's nurse - Raquel Sarna, RN

## 2019-02-24 NOTE — [facility] Suicide Risk Assessment (Signed)
[facility]   Nursing information obtained from:  Patient Demographic factors:  Divorced or widowed, Caucasian Current Mental Status:  NA Loss Factors:  NA Historical Factors:  Prior suicide attempts Risk Reduction Factors:  NA  Total Time spent with patient: 1 hour Principal Problem: Severe episode of recurrent major depressive disorder, without psychotic features (HCC) Diagnosis:  Principal Problem:   Severe episode of recurrent major depressive disorder, without psychotic features (HCC) Active Problems:   Suicide attempt (HCC)   Chronic pain syndrome   Cannabis abuse  Subjective Data: Patient seen and chart reviewed.  Patient reports that she just got overwhelmed with her chronic pain.  This patient was transferred to us from the medical service where she was briefly admitted for stabilization of an overdose.  Patient took an overdose of a combination of Cymbalta gabapentin and Flexeril.  When she came into the hospital she was sedated but was able to give some history and it is documented that she said she wanted to die wanted to kill her self.  Patient admits that she has been feeling worn out although at times it is difficult to get a history from her because she seems to very much want to minimize her symptoms.  She does admit to me however that she tried to kill her self.  Admits that she feels like she is under a lot of strain.  She feels like she does work for everyone both at her job and at her home and cannot take care of herself.  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  She also lives at home with her mother and adolescent daughter and portrays herself as having to take care of everyone else in the household.  She has been dealing with chronic pain now for several years.  She says the chronic pain started in her back but now she complains of pain in her back and her hips and in her knees.  She has been seeing her primary care doctor for  this and has been taking Flexeril and Cymbalta and gabapentin but apparently that has done little to ameliorate her pain.  In the interview today the patient clearly looks anxious and like she is in a lot of distress but she tries to minimize any of her symptoms.  She tells me that she will simply never tried to hurt herself again in the future.  She denied on direct questioning any psychotic symptoms but at the end of the interview told me that she wanted to file a complaint because the people in the emergency room were trying to strangle her to death.  She seems convinced that that is the only explanation for what she remembers.  Patient uses marijuana on a regular basis although perhaps not daily.  Denies that she is using any other drugs currently.  Continued Clinical Symptoms:  Alcohol Use Disorder Identification Test Final Score (AUDIT): 0 The "Alcohol Use Disorders Identification Test", Guidelines for Use in Primary Care, Second Edition.  World Science writerHealth Organization [facility](WHO). Score between 0-7:  no or low risk or alcohol related problems. Score between 8-15:  moderate risk of alcohol related problems. Score between 16-19:  high risk of alcohol related problems. Score 20 or above:  warrants further diagnostic evaluation for alcohol dependence and treatment.   CLINICAL FACTORS:   Depression:   Anhedonia Hopelessness Impulsivity Insomnia Severe   Musculoskeletal: Strength & Muscle Tone: within normal limits Gait & Station: normal Patient leans: N/A  Psychiatric Specialty Exam: Physical Exam  Nursing note and vitals reviewed. Constitutional: She appears well-developed and well-nourished.  HENT:  Head: Normocephalic and atraumatic.  Eyes: Pupils are equal, round, and reactive to light. Conjunctivae are normal.  Neck: Normal range of motion.  Cardiovascular: Regular rhythm and normal heart sounds.  Respiratory: Effort normal.  GI: Soft.  Musculoskeletal: Normal range of motion.   Neurological: She is alert.  Skin: Skin is warm and dry.  Psychiatric: Her mood appears anxious. Her speech is tangential. She is agitated. She is not aggressive. Thought content is paranoid. Cognition and memory are impaired. She expresses impulsivity. She exhibits a depressed mood. She exhibits abnormal recent memory.    Review of Systems  Constitutional: Negative.   HENT: Negative.   Eyes: Negative.   Respiratory: Negative.   Cardiovascular: Negative.   Gastrointestinal: Negative.   Musculoskeletal: Negative.   Skin: Negative.   Neurological: Negative.   Psychiatric/Behavioral: Positive for depression, memory loss, substance abuse and suicidal ideas. Negative for hallucinations. The patient is nervous/anxious and has insomnia.     Blood pressure (!) 113/93, pulse 96, temperature 98.4 F (36.9 C), temperature source Oral, resp. rate 18, height 5\' 2"  (1.575 m), weight 79.8 kg, SpO2 100 %.Body mass index is 32.19 kg/m.  General Appearance: Disheveled  Eye Contact:  Fair  Speech:  Slow  Volume:  Decreased  Mood:  Anxious and Depressed  Affect:  Congruent  Thought Process:  Disorganized  Orientation:  Full (Time, Place, and Person)  Thought Content:  Illogical, Rumination and Tangential  Suicidal Thoughts:  Yes.  without intent/plan  Homicidal Thoughts:  No  Memory:  Immediate;   Fair Recent;   Fair Remote;   Fair  Judgement:  Impaired  Insight:  Shallow  Psychomotor Activity:  Decreased  Concentration:  Concentration: Fair  Recall:  [employer] of Knowledge:  Fair  Language:  Fair  Akathisia:  No  Handed:  Right  AIMS (if indicated):     Assets:  Desire for Improvement Housing Resilience Social Support  ADL's:  Intact  Cognition:  Impaired,  Mild  Sleep:         COGNITIVE FEATURES THAT CONTRIBUTE TO RISK:  Closed-mindedness    SUICIDE RISK:   Mild:  Suicidal ideation of limited frequency, intensity, duration, and specificity.  There are no identifiable plans,  no associated intent, mild dysphoria and related symptoms, good self-control (both objective and subjective assessment), few other risk factors, and identifiable protective factors, including available and accessible social support.  PLAN OF CARE: Patient admitted to psychiatric ward.  15-minute checks in place.  Patient will be included in individual and group psychotherapy.  I discussed medication management with her and made a couple of recommendations including increasing the dose of Cymbalta to 90 mg a day as well as increasing the newly started Abilify to 5 mg.  Patient is agreeable but clearly would prefer to be getting discharge sooner rather than later.  We will hold off on the Flexeril which does not seem to have probably been adding much to her chronic pain.  Rethink the gabapentin once she is a little more stable.  Work on trying to make sure she has appropriate psychiatric treatment at discharge.  Psychoeducation about the problems with chronic marijuana use will be done.  I certify that inpatient services furnished can reasonably be expected to improve the patient's condition.   Alethia Berthold, MD 02/24/2019, 3:49 PM

## 2019-02-24 NOTE — Progress Notes (Signed)
[employer] [facility] [place] [facility]   PATIENT NAME: Renette Buttersngel Keil    MR#:  [id number]  DATE OF BIRTH:  07/02/1981  SUBJECTIVE:  Angelique Blonderenise SI  REVIEW OF SYSTEMS:    Review of Systems  Constitutional: Negative for fever, chills weight loss HENT: Negative for ear pain, nosebleeds, congestion, facial swelling, rhinorrhea, neck pain, neck stiffness and ear discharge.   Respiratory: Negative for cough, shortness of breath, wheezing  Cardiovascular: Negative for chest pain, palpitations and leg swelling.  Gastrointestinal: Negative for heartburn, abdominal pain, vomiting, diarrhea or consitpation Genitourinary: Negative for dysuria, urgency, frequency, hematuria Musculoskeletal: Negative for back pain or joint pain Neurological: Negative for dizziness, seizures, syncope, focal weakness,  numbness and headaches.  Hematological: Does not bruise/bleed easily.  Psychiatric/Behavioral: Negative for hallucinations, confusion, ++ dysphoric mood    Tolerating Diet: yes      DRUG ALLERGIES:  No Known Allergies  VITALS:  Blood pressure 121/85, pulse 72, temperature 98.1 F (36.7 C), temperature source Oral, resp. rate 18, height 5\' 2"  (1.575 m), weight 79.4 kg, SpO2 100 %.  PHYSICAL EXAMINATION:  Constitutional: Appears well-developed and well-nourished. No distress. HENT: Normocephalic. Marland Kitchen. Oropharynx is clear and moist.  Eyes: Conjunctivae and EOM are normal. PERRLA, no scleral icterus.  Neck: Normal ROM. Neck supple. No JVD. No tracheal deviation. CVS: RRR, S1/S2 +, no murmurs, no gallops, no carotid bruit.  Pulmonary: Effort and breath sounds normal, no stridor, rhonchi, wheezes, rales.  Abdominal: Soft. BS +,  no distension, tenderness, rebound or guarding.  Musculoskeletal: Normal range of motion. No edema and no tenderness.  Neuro: Alert. CN 2-12 grossly intact. No focal deficits. Skin: Skin is warm and dry. No rash noted. Psychiatric: Normal mood and affect.       LABORATORY PANEL:   CBC Recent Labs  Lab 02/21/19 0449  WBC 8.1  HGB 9.5*  HCT 29.5*  PLT 308   ------------------------------------------------------------------------------------------------------------------  Chemistries  Recent Labs  Lab 02/20/19 2330 02/21/19 0449  02/21/19 1754  NA 139 139   < > 138  K 3.4* 3.8   < > 3.4*  CL 109 114*   < > 111  CO2 22 20*   < > 21*  GLUCOSE 90 109*   < > 89  BUN 10 10   < > 6  CREATININE 0.72 0.79   < > 0.72  CALCIUM 8.4* 7.1*   < > 7.3*  MG  --  2.0  --   --   AST 19  --   --   --   ALT 18  --   --   --   ALKPHOS 20*  --   --   --   BILITOT 0.9  --   --   --    < > = values in this interval not displayed.   ------------------------------------------------------------------------------------------------------------------  Cardiac Enzymes Recent Labs  Lab 02/20/19 2330 02/21/19 0449 02/21/19 1109  TROPONINI <0.03 <0.03 <0.03   ------------------------------------------------------------------------------------------------------------------  RADIOLOGY:  No results found.   ASSESSMENT AND PLAN:   38 year old female with a history of of depression who presented to the emergency room after suicidal attempt with overdose of Flexeril, Cymbalta and Neurontin.  1.  Acute hypoxic respiratory failure in the setting of drug overdose with numerous medications.  Patient was intubated to protect airway in the emergency room.  She has now been extubated.   Respiratory status is stable.  2.  Drug overdose with suicidal attempt: Patient was eval by psychiatry.  As per psychiatry recommendation she will need further treatment in inpatient psychiatric unit.  She did have IVC in place.  She has one-to-one Actuary.  3.  EtOH abuse: Patient was on CIWA protocol with uneventful detox.  4.  Depression: Management as per psychiatry. Continue cymbalta.  5.  Hypokalemia: This was repleted.      Management plans discussed with  the patient and she is in agreement.  CODE STATUS: full  TOTAL TIME TAKING CARE OF THIS PATIENT: 25 minutes.     POSSIBLE D/C today medically stable for transfer to inpatient PSYCHIATRY, DEPENDING ON CLINICAL CONDITION.   Bettey Costa M.D on 02/24/2019 at 9:15 AM  Between 7am to 6pm - Pager - [phone] After 6pm go to [email or web address] - password [email or web address] [facility]  Office  [phone]  CC: Primary care physician; [facility]  Note: This dictation was prepared with Dragon dictation along with smaller phrase technology. Any transcriptional errors that result from this process are unintentional.

## 2019-02-24 NOTE — TOC Progression Note (Addendum)
Clinical Education officer, museum (CSW) contacted [facility]) in [place] and inquired about the referral and if they can accept the patient. Per [facility] staff they are still reviewing the referral and will call CSW back.   [employer], LCSW [phone]

## 2019-02-24 NOTE — H&P (Signed)
Psychiatric Admission Assessment Adult  Patient Identification: Toni Friedman MRN:  [id number] Date of Evaluation:  02/24/2019 Chief Complaint:  major depression disorder Principal Diagnosis: Severe episode of recurrent major depressive disorder, without psychotic features ([place]) Diagnosis:  Principal Problem:   Severe episode of recurrent major depressive disorder, without psychotic features ([place]) Active Problems:   Suicide attempt ([place])   Chronic pain syndrome   Cannabis abuse  History of Present Illness: Patient seen and chart reviewed.  This is a patient who was transferred from the medical service because of an overdose.  Came to the hospital after family found her with altered mental status and evidence of overdosing on her prescription medicine.  In the emergency room the patient admitted to suicidal intent.  Patient has been medically stabilized and transferred to psychiatry.  Patient admits that she has been under a great deal of stress.  Major stress from her perspective is her chronic pain.  Pain is been present for years and is getting worse and is in her back her hips and her knees.  Additionally it sounds like she has a general sense that she is constantly doing things for other people at her job and at home and does not have the ability to take care of herself.  She cannot explain to me exactly what was on her mind when she took the overdose other than wanting the pain to stop.  Mood is been down and depressed for some indefinite period of time.  She is taking Cymbalta prescribed by her primary care doctor.  It looks like that was probably started for pain.  Not seeing a therapist.  Patient denies any hallucinations but she makes an odd comment about believing the people in the emergency room were trying to kill her.  Patient admits to regular marijuana use which she justifies as being for her chronic pain.  Denies that it is every day. Associated Signs/Symptoms: Depression Symptoms:   depressed mood, anhedonia, insomnia, psychomotor retardation, feelings of worthlessness/guilt, difficulty concentrating, hopelessness, suicidal attempt, (Hypo) Manic Symptoms:  None Anxiety Symptoms:  Excessive Worry, Psychotic Symptoms:  Paranoia, PTSD Symptoms: Negative Total Time spent with patient: 1 hour  Past Psychiatric History: Patient has no previous hospitalizations.  No previous suicide attempts.  Has been taking Cymbalta probably for as much as a year or more at 60 mg a day.  She vaguely feels like it might of helped her to feel more calm and stable at some time in the past.  No other antidepressant medication that she can remember.  No history of mania or psychosis.  Chart indicates that the family has noticed that her mood has been labile and bad for quite some time.  Is the patient at risk to self? Yes.    Has the patient been a risk to self in the past 6 months? Yes.    Has the patient been a risk to self within the distant past? No.  Is the patient a risk to others? No.  Has the patient been a risk to others in the past 6 months? No.  Has the patient been a risk to others within the distant past? No.   Prior Inpatient Therapy:   Prior Outpatient Therapy:    Alcohol Screening: 1. How often do you have a drink containing alcohol?: Never 2. How many drinks containing alcohol do you have on a typical day when you are drinking?: 1 or 2 3. How often do you have six or more drinks on  one occasion?: Never AUDIT-C Score: 0 4. How often during the last year have you found that you were not able to stop drinking once you had started?: Never 5. How often during the last year have you failed to do what was normally expected from you becasue of drinking?: Never 6. How often during the last year have you needed a first drink in the morning to get yourself going after a heavy drinking session?: Never 7. How often during the last year have you had a feeling of guilt of remorse after  drinking?: Never 8. How often during the last year have you been unable to remember what happened the night before because you had been drinking?: Never 9. Have you or someone else been injured as a result of your drinking?: No 10. Has a relative or friend or a doctor or another health worker been concerned about your drinking or suggested you cut down?: No Alcohol Use Disorder Identification Test Final Score (AUDIT): 0 Alcohol Brief Interventions/Follow-up: AUDIT Score <7 follow-up not indicated, Continued Monitoring Substance Abuse History in the last 12 months:  Yes.   Consequences of Substance Abuse: Negative Previous Psychotropic Medications: Yes  Psychological Evaluations: No  Past Medical History:  Past Medical History:  Diagnosis Date  . Anxiety   . Arthritis    knees  . Depression   . History of alcohol abuse     Past Surgical History:  Procedure Laterality Date  . KNEE SURGERY Left 2000  . TIBIA IM NAIL INSERTION Right 12/19/2015   Procedure: INTRAMEDULLARY (IM) NAIL TIBIAL;  Surgeon: Kennedy Bucker, MD;  Location: [facility] ORS;  Service: Orthopedics;  Laterality: Right;  . TUBAL LIGATION    . WRIST SURGERY  2000   fracture   Family History:  Family History  Problem Relation Age of Onset  . Lupus Mother   . Diabetes Father   . Cancer Neg Hx   . COPD Neg Hx   . Heart disease Neg Hx   . Hypertension Neg Hx   . Stroke Neg Hx    Family Psychiatric  History: Patient believes that her mother has depression.  No history of suicide in the family Tobacco Screening: Have you used any form of tobacco in the last 30 days? (Cigarettes, Smokeless Tobacco, Cigars, and/or Pipes): Yes Tobacco use, Select all that apply: 5 or more cigarettes per day Are you interested in Tobacco Cessation Medications?: Yes, will notify MD for an order Counseled patient on smoking cessation including recognizing danger situations, developing coping skills and basic information about quitting provided:  Refused/Declined practical counseling Social History:  Social History   Substance and Sexual Activity  Alcohol Use No   Comment: socially     Social History   Substance and Sexual Activity  Drug Use No    Additional Social History:                           Allergies:  No Known Allergies Lab Results:  Results for orders placed or performed during the hospital encounter of 02/20/19 (from the past 48 hour(s))  Glucose, capillary     Status: None   Collection Time: 02/22/19  5:09 PM  Result Value Ref Range   Glucose-Capillary 78 70 - 99 mg/dL  Glucose, capillary     Status: Abnormal   Collection Time: 02/23/19 12:02 AM  Result Value Ref Range   Glucose-Capillary 132 (H) 70 - 99 mg/dL  Glucose, capillary  Status: Abnormal   Collection Time: 02/23/19  5:43 AM  Result Value Ref Range   Glucose-Capillary 100 (H) 70 - 99 mg/dL  Glucose, capillary     Status: None   Collection Time: 02/23/19 11:40 AM  Result Value Ref Range   Glucose-Capillary 81 70 - 99 mg/dL   Comment 1 Notify RN    Comment 2 Document in Chart   Glucose, capillary     Status: Abnormal   Collection Time: 02/24/19 12:05 AM  Result Value Ref Range   Glucose-Capillary 127 (H) 70 - 99 mg/dL  Glucose, capillary     Status: None   Collection Time: 02/24/19  6:12 AM  Result Value Ref Range   Glucose-Capillary 82 70 - 99 mg/dL    Blood Alcohol level:  Lab Results  Component Value Date   ETH <10 02/20/2019    Metabolic Disorder Labs:  No results found for: HGBA1C, MPG No results found for: PROLACTIN No results found for: CHOL, TRIG, HDL, CHOLHDL, VLDL, LDLCALC  Current Medications: Current Facility-Administered Medications  Medication Dose Route Frequency Provider Last Rate Last Dose  . acetaminophen (TYLENOL) tablet 650 mg  650 mg Oral Q6H PRN Mariel CraftMaurer, Sheila M, MD      . alum & mag hydroxide-simeth (MAALOX/MYLANTA) 200-200-20 MG/5ML suspension 30 mL  30 mL Oral Q4H PRN Mariel CraftMaurer, Sheila M, MD       . Melene Muller[START ON 02/25/2019] ARIPiprazole (ABILIFY) tablet 5 mg  5 mg Oral Daily [place] Haydon T, MD      . bisacodyl (DULCOLAX) suppository 10 mg  10 mg Rectal Daily PRN Mariel CraftMaurer, Sheila M, MD      . Melene Muller[START ON 02/25/2019] DULoxetine (CYMBALTA) DR capsule 90 mg  90 mg Oral Daily Grier Czerwinski, Jackquline DenmarkJohn T, MD      . Melene Muller[START ON 02/25/2019] folic acid (FOLVITE) tablet 1 mg  1 mg Oral Daily Mariel CraftMaurer, Sheila M, MD      . hydrOXYzine (ATARAX/VISTARIL) tablet 25 mg  25 mg Oral Q4H PRN Mariel CraftMaurer, Sheila M, MD      . ipratropium-albuterol (DUONEB) 0.5-2.5 (3) MG/3ML nebulizer solution 3 mL  3 mL Nebulization Q6H PRN Mariel CraftMaurer, Sheila M, MD      . magnesium hydroxide (MILK OF MAGNESIA) suspension 30 mL  30 mL Oral Daily PRN Mariel CraftMaurer, Sheila M, MD      . Melene Muller[START ON 02/25/2019] multivitamin with minerals tablet 1 tablet  1 tablet Oral Daily Mariel CraftMaurer, Sheila M, MD      . Melene Muller[START ON 02/25/2019] nicotine (NICODERM CQ - dosed in mg/24 hours) patch 21 mg  21 mg Transdermal Daily Mariel CraftMaurer, Sheila M, MD      . pantoprazole (PROTONIX) EC tablet 40 mg  40 mg Oral BID AC Mariel CraftMaurer, Sheila M, MD      . sennosides (SENOKOT) 8.8 MG/5ML syrup 5 mL  5 mL Per Tube BID PRN Mariel CraftMaurer, Sheila M, MD      . Melene Muller[START ON 02/25/2019] thiamine (VITAMIN B-1) tablet 100 mg  100 mg Oral Daily Mariel CraftMaurer, Sheila M, MD       PTA Medications: Medications Prior to Admission  Medication Sig Dispense Refill Last Dose  . [START ON 02/25/2019] ARIPiprazole (ABILIFY) 2 MG tablet Take 1 tablet (2 mg total) by mouth daily. 30 tablet 0 unknown at unknown  . cyclobenzaprine (FLEXERIL) 10 MG tablet Take 10 mg by mouth 3 (three) times daily as needed for muscle spasms.   prn at prn  . DULoxetine (CYMBALTA) 60 MG capsule Take 60 mg by  mouth daily.   02/20/2019 at unknown  . nicotine (NICODERM CQ - DOSED IN MG/24 HOURS) 21 mg/24hr patch Place 1 patch (21 mg total) onto the skin daily. 28 patch 0 unknown at unknown  . nicotine polacrilex (NICORETTE) 2 MG gum Take 1 each (2 mg total) by mouth as needed  for smoking cessation. 100 tablet 0 unknown at unknown    Musculoskeletal: Strength & Muscle Tone: within normal limits Gait & Station: normal Patient leans: N/A  Psychiatric Specialty Exam: Physical Exam  Nursing note and vitals reviewed. Constitutional: She appears well-developed and well-nourished.  HENT:  Head: Normocephalic and atraumatic.  Eyes: Pupils are equal, round, and reactive to light. Conjunctivae are normal.  Neck: Normal range of motion.  Cardiovascular: Regular rhythm and normal heart sounds.  Respiratory: Effort normal.  GI: Soft.  Musculoskeletal: Normal range of motion.  Neurological: She is alert.  Skin: Skin is warm and dry.  Psychiatric: Her speech is delayed. She is slowed. Thought content is paranoid. Cognition and memory are impaired. She expresses impulsivity. She exhibits a depressed mood. She expresses suicidal ideation. She expresses no suicidal plans.    Review of Systems  Constitutional: Negative.   HENT: Negative.   Eyes: Negative.   Respiratory: Negative.   Cardiovascular: Negative.   Gastrointestinal: Negative.   Musculoskeletal: Negative.   Skin: Negative.   Neurological: Negative.   Psychiatric/Behavioral: Positive for depression, memory loss, substance abuse and suicidal ideas. Negative for hallucinations. The patient is nervous/anxious and has insomnia.     Blood pressure (!) 113/93, pulse 96, temperature 98.4 F (36.9 C), temperature source Oral, resp. rate 18, height 5\' 2"  (1.575 m), weight 79.8 kg, SpO2 100 %.Body mass index is 32.19 kg/m.  General Appearance: Disheveled  Eye Contact:  Fair  Speech:  Slow  Volume:  Decreased  Mood:  Anxious and Depressed  Affect:  Congruent  Thought Process:  Coherent  Orientation:  Full (Time, Place, and Person)  Thought Content:  Illogical, Paranoid Ideation, Rumination and Tangential  Suicidal Thoughts:  Yes.  without intent/plan  Homicidal Thoughts:  No  Memory:  Immediate;    Fair Recent;   Poor Remote;   Poor  Judgement:  Impaired  Insight:  Lacking  Psychomotor Activity:  Decreased  Concentration:  Concentration: Poor  Recall:  Poor  Fund of Knowledge:  Poor  Language:  Fair  Akathisia:  No  Handed:  Right  AIMS (if indicated):     Assets:  Desire for Improvement Financial Resources/Insurance Housing Resilience Social Support  ADL's:  Intact  Cognition:  Impaired,  Mild  Sleep:       Treatment Plan Summary: Daily contact with patient to assess and evaluate symptoms and progress in treatment, Medication management and Plan Patient admitted to psychiatric ward.  Patient will be on 15-minute checks.  Including individual and group therapy.  Propose a couple of medication changes to start with including increasing the dose of Cymbalta to 90 mg and increasing Abilify which was recently started to 5 mg.  She agrees to this.  Try to work on improving her insight.  Continue what management we can for chronic pain.  Patient has been referred to a pain clinic but has not actually followed up on it yet outside the hospital.  When she is discharged we will try to make sure she has appropriate referrals in place  Observation Level/Precautions:  15 minute checks  Laboratory:  Chemistry Profile  Psychotherapy:    Medications:    Consultations:  Discharge Concerns:    Estimated LOS:  Other:     Physician Treatment Plan for Primary Diagnosis: Severe episode of recurrent major depressive disorder, without psychotic features ([facility]) Long Term Goal(s): Improvement in symptoms so as ready for discharge  Short Term Goals: Ability to verbalize feelings will improve and Ability to disclose and discuss suicidal ideas  Physician Treatment Plan for Secondary Diagnosis: Principal Problem:   Severe episode of recurrent major depressive disorder, without psychotic features ([facility]) Active Problems:   Suicide attempt ([facility])   Chronic pain syndrome   Cannabis abuse  Long  Term Goal(s): Improvement in symptoms so as ready for discharge  Short Term Goals: Ability to maintain clinical measurements within normal limits will improve and Compliance with prescribed medications will improve  I certify that inpatient services furnished can reasonably be expected to improve the patient's condition.    Mordecai RasmussenJohn Sylvester Minton, MD 6/10/20203:55 PM

## 2019-02-24 NOTE — BHH Group Notes (Signed)
LCSW Group Therapy Note  02/24/2019 1:00 PM  Type of Therapy/Topic:  Group Therapy:  Emotion Regulation  Participation Level:  Did Not Attend   Description of Group:   The purpose of this group is to assist patients in learning to regulate negative emotions and experience positive emotions. Patients will be guided to discuss ways in which they have been vulnerable to their negative emotions. These vulnerabilities will be juxtaposed with experiences of positive emotions or situations, and patients will be challenged to use positive emotions to combat negative ones. Special emphasis will be placed on coping with negative emotions in conflict situations, and patients will process healthy conflict resolution skills.  Therapeutic Goals: 1. Patient will identify two positive emotions or experiences to reflect on in order to balance out negative emotions 2. Patient will label two or more emotions that they find the most difficult to experience 3. Patient will demonstrate positive conflict resolution skills through discussion and/or role plays  Summary of Patient Progress: X  Therapeutic Modalities:   Cognitive Behavioral Therapy Feelings Identification Dialectical Behavioral Therapy  Assunta Curtis, MSW, LCSW 02/24/2019 2:31 PM

## 2019-02-24 NOTE — Tx Team (Signed)
Initial Treatment Plan 02/24/2019 2:29 PM Toni Friedman EXM:[id number]    PATIENT STRESSORS: Health problems Medication change or noncompliance Substance abuse   PATIENT STRENGTHS: Ability for insight Active sense of humor Average or above average intelligence Communication skills Supportive family/friends   PATIENT IDENTIFIED PROBLEMS:  suicidal 02/24/2019   Depression 02/24/2019                   DISCHARGE CRITERIA:  Ability to meet basic life and health needs Improved stabilization in mood, thinking, and/or behavior Medical problems require only outpatient monitoring  PRELIMINARY DISCHARGE PLAN: Outpatient therapy Return to previous work or school arrangements  PATIENT/FAMILY INVOLVEMENT: This treatment plan has been presented to and reviewed with the patient, Toni Friedman, and/or family member, .  The patient and family have been given the opportunity to ask questions and make suggestions.  Leodis Liverpool, RN 02/24/2019, 2:29 PM

## 2019-02-24 NOTE — TOC Progression Note (Addendum)
Transition of Care [facility]) - Progression Note    Patient Details  Name: Toni Friedman MRN: [id number] Date of Birth: Sep 02, 1981  [facility] CM/SW Contact  Babygirl Trager, Veronia Beets, [place] Phone Number: 02/24/2019, 11:47 AM  Clinical Narrative:   Per RN patient has been accepted at [place] unit [facility]) today. Clinical Education officer, museum (CSW) notified [facility] in [place]. Plan is for patient to D/C to [facility] today. Please reconsult if future social work needs arise. CSW signing off.         Expected Discharge Plan and Services           Expected Discharge Date: 02/23/19                                     Social Determinants of Health (SDOH) Interventions    Readmission Risk Interventions No flowsheet data found.

## 2019-02-25 MED ORDER — ARIPIPRAZOLE 5 MG PO TABS: 5.0000 mg | ORAL_TABLET | Freq: Every day | ORAL | 0 refills | Status: DC

## 2019-02-25 MED ORDER — MENTHOL 3 MG MT LOZG
1.0000 | LOZENGE | OROMUCOSAL | Status: DC | PRN
  Filled 2019-02-25: qty 9

## 2019-02-25 MED ORDER — TRAZODONE HCL 100 MG PO TABS
100.0000 mg | ORAL_TABLET | Freq: Once | ORAL | Status: AC
  Administered 2019-02-25: 100 mg via ORAL
  Filled 2019-02-25: qty 1

## 2019-02-25 MED ORDER — DULOXETINE HCL 30 MG PO CPEP: 90.0000 mg | ORAL_CAPSULE | Freq: Every day | ORAL | 0 refills | Status: DC

## 2019-02-25 MED ORDER — PANTOPRAZOLE SODIUM 40 MG PO TBEC: 40.0000 mg | DELAYED_RELEASE_TABLET | Freq: Two times a day (BID) | ORAL | 0 refills | Status: DC

## 2019-02-25 MED ORDER — GABAPENTIN 300 MG PO CAPS: 300.0000 mg | ORAL_CAPSULE | Freq: Three times a day (TID) | ORAL | 0 refills | Status: DC

## 2019-02-25 NOTE — BHH Group Notes (Signed)
LCSW Group Therapy Note  02/25/2019 2:08 PM  Type of Therapy/Topic:  Group Therapy:  Balance in Life  Participation Level:  Active  Description of Group:    This group will address the concept of balance and how it feels and looks when one is unbalanced. Patients will be encouraged to process areas in their lives that are out of balance and identify reasons for remaining unbalanced. Facilitators will guide patients in utilizing problem-solving interventions to address and correct the stressor making their life unbalanced. Understanding and applying boundaries will be explored and addressed for obtaining and maintaining a balanced life. Patients will be encouraged to explore ways to assertively make their unbalanced needs known to significant others in their lives, using other group members and facilitator for support and feedback.  Therapeutic Goals: 1. Patient will identify two or more emotions or situations they have that consume much of in their lives. 2. Patient will identify signs/triggers that life has become out of balance:  3. Patient will identify two ways to set boundaries in order to achieve balance in their lives:  4. Patient will demonstrate ability to communicate their needs through discussion and/or role plays  Summary of Patient Progress: Pt was appropriate and respectful in group. Pt was able to identify what she would want to change in 8 different domains in life in order to have a healthy balance in life. Pt stated that she would like to get into yoga and stretching in order to gain balance in the physical health aspect. Pt discussed how she needs to learn how to balance home and work life because when she gets home from work, she is too tired to spend time with her family.     Therapeutic Modalities:   Cognitive Behavioral Therapy Solution-Focused Therapy Assertiveness Training  Evalina Field, MSW, LCSW Clinical Social Work 02/25/2019 2:08 PM

## 2019-02-25 NOTE — Plan of Care (Signed)
Patient observed to demonstrate compliance with plan of care to maintain safety, improve self control and set goals to experience no injury. She has expressed willingness to participate in plan of care. Problem: Education: Goal: Knowledge of [place] [facility] Education information/materials will improve Outcome: Progressing   Problem: Coping: Goal: Ability to verbalize frustrations and anger appropriately will improve Outcome: Progressing   Problem: Safety: Goal: Periods of time without injury will increase Outcome: Progressing

## 2019-02-25 NOTE — Progress Notes (Signed)
Recreation Therapy Notes  INPATIENT RECREATION THERAPY ASSESSMENT  Patient Details Name: Toni Friedman MRN: [id number] DOB: 12/23/1980 Today's Date: 02/25/2019       Information Obtained From: Patient  Able to Participate in Assessment/Interview: Yes  Patient Presentation: Responsive  Reason for Admission (Per Patient): Active Symptoms, Other (Comments)(depression)  Patient Stressors: Other (Comment)(COVID-19)  Coping Skills:   Music, Prayer  Leisure Interests (2+):  Music - Listen, Sports - Swimming, Therapist, music - [employer], Therapist, music - Vegetable gardening  Frequency of Recreation/Participation: Financial risk analyst Resources:  Yes  Community Resources:  Library  Current Use:    If no, Barriers?:    Expressed Interest in [employer] Information:    [employer] of Residence:  Insurance underwriter  Patient Main Form of Transportation: Musician  Patient Strengths:  Surveyor, mining, hardworking  Patient Identified Areas of Improvement:  Taking care of self better  Patient Goal for Hospitalization:  learn the signs of depression  Current SI (including self-harm):  No  Current HI:  No  Current AVH: No  Staff Intervention Plan: Group Attendance, Collaborate with Interdisciplinary Treatment Team  Consent to Intern Participation: N/A  Yaasir Menken 02/25/2019, 10:51 AM

## 2019-02-25 NOTE — Progress Notes (Signed)
D: Patient stated slept good last night .Stated appetite good and energy level  normal. Stated concentration good . Stated on Depression scale 6 , hopeless 0 and anxiety 5 .( low 0-10 high) Denies suicidal  homicidal ideations  .  No auditory hallucinations  No pain concerns . Appropriate ADL'S. Interacting with peers and staff. Patient aware of information received on [facility] and unit programs  able to verbalize understanding . Able to vent frustrations and anger appropriately. Patient aware of resources available .  Working on [employer] .Attending  and participation with unit programing .Able to verbalize feelingsVoice of no safety concerns No anger management or control issues . Compliant  with medication, verbalize understanding of  medication received. Focus  " Going  Home  "     A: Encourage patient participation with unit programming . Instruction  Given on  Medication , verbalize understanding.  R: Voice no other concerns. Staff continue to monitor

## 2019-02-25 NOTE — BHH Counselor (Signed)
Adult Comprehensive Assessment  Patient ID: Toni Friedman, female   DOB: 02/05/1981, 38 y.o.   MRN: [id number]  Information Source: Information source: Patient  Current Stressors:  Patient states their primary concerns and needs for treatment are:: "pain" Patient states their goals for this hospitilization and ongoing recovery are:: "to leave it all behind me" Educational / Learning stressors: Pt dropped out in 10th grade Employment / Job issues: employed at [employer] Family Relationships: Good relationships with family Financial / Lack of resources (include bankruptcy): employed Substance abuse: pt denies  Living/Environment/Situation:  Living Arrangements: Parent, Children, Spouse/significant other Living conditions (as described by patient or guardian): "clean, loving, beautiful" Who else lives in the home?: Pts boyfriend, pts mother, and pts daughter How long has patient lived in current situation?: 8 years What is atmosphere in current home: Comfortable, ParamedicLoving, Supportive  Family History:  Marital status: Separated Separated, when?: 8-9 years ago Are you sexually active?: Yes What is your sexual orientation?: heterosexual Has your sexual activity been affected by drugs, alcohol, medication, or emotional stress?: pt denies Does patient have children?: Yes How many children?: 2 How is patient's relationship with their children?: 18yo son and 14yo daughter - pt reports a great relationship with her kids  Childhood History:  By whom was/is the patient raised?: Both parents, Grandparents Additional childhood history information: Pt reports she was raised by both parents and her grandparents Description of patient's relationship with caregiver when they were a child: "fine, normal" Patient's description of current relationship with people who raised him/her: "the same, fine" Does patient have siblings?: Yes Number of Siblings: 1 Description of patient's current  relationship with siblings: a brother who pt reports a "fine" relationship with Did patient suffer any verbal/emotional/physical/sexual abuse as a child?: No Did patient suffer from severe childhood neglect?: No Has patient ever been sexually abused/assaulted/raped as an adolescent or adult?: No Was the patient ever a victim of a crime or a disaster?: No Witnessed domestic violence?: No Has patient been effected by domestic violence as an adult?: No  Education:  Highest grade of school patient has completed: 9th, pt dropped out in 10th grade Currently a student?: No Learning disability?: No  Employment/Work Situation:   Employment situation: Employed Where is patient currently employed?: [employer] How long has patient been employed?: 1 year Patient's job has been impacted by current illness: No What is the longest time patient has a held a job?: 5 years 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 Did You Receive Any Psychiatric Treatment/Services While in [employer]?: No Are There Guns or Other Weapons in Your Home?: No Are These ComptrollerWeapons Safely Secured?: (N/A)  Financial Resources:   Financial resources: Income from employment, Medicaid Does patient have a representative payee or guardian?: No  Alcohol/Substance Abuse:   What has been your use of drugs/alcohol within the last 12 months?: Pt denies Alcohol/Substance Abuse Treatment Hx: Past Tx, Outpatient If yes, describe treatment: Pt reports she had to spend 30 days in jail and take a class for alcohol use Has alcohol/substance abuse ever caused legal problems?: Yes(Pt reports upcoming courtdate 6/16 for traffic violations)  Social Support System:   Patient's Community Support System: Good Describe Community Support System: Family Type of faith/religion: "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"  Leisure/Recreation:   Leisure and Hobbies: "swim, listen to music, relax"  Strengths/Needs:   What is the patient's  perception of their strengths?: "being a good mom and being a good daughter" Patient states  they can use these personal strengths during their treatment to contribute to their recovery: "because we stick together and motivate eachother" Patient states these barriers may affect/interfere with their treatment: pt denies Patient states these barriers may affect their return to the community: Pt declines mental health follow up and wants to continue to follow up with her primary care doctor  Discharge Plan:   Currently receiving community mental health services: No Patient states concerns and preferences for aftercare planning are: Pt wants to follow up with her PCP Patient states they will know when they are safe and ready for discharge when: "I'm ready to go now" Does patient have access to transportation?: Yes Does patient have financial barriers related to discharge medications?: No Will patient be returning to same living situation after discharge?: Yes  Summary/Recommendations:   Summary and Recommendations (to be completed by the evaluator): Pt is a 38 yo female living in [place], [place] ([place]) with her boyfriend, mother, and daughter. Pt presents to the hospital seeking treatment for SI and overdose. Pt has a diagnosis of MDD, severe, recurrent, without psychosis. Pt is employed, separated of 8 years, has 2 children, a good support system, and [employer]. Pt denies SA and past trauma/abuse. Pt denies SI/HI/AVH currently. Pt declines mental health follow up with plans to continue seeing her primary care doctor for medication management. Recommendations for pt include: crisis stabilization, therapeutic milieu, encourage group attendance and participation, medication management for mood stabilization, and development for comprehensive mental wellness plan. CSW assessing for appropriate referrals.  Glen Elder MSW LCSW 02/25/2019 10:20 AM

## 2019-02-25 NOTE — Progress Notes (Signed)
Recreation Therapy Notes  Date: 02/25/2019  Time: 9:30 am  Location: Craft room  Behavioral response: Appropriate  Intervention Topic: Values  Discussion/Intervention:  Group content today was focused on values. The group identified what values are and where they come from. Individuals expressed some values and how many they have. Patients described how they go about add or removing values. The group described the importance of having values and how they go about using them in daily life. Patient participated in the intervention "My Values" where they were able to pick out values that were important to them and made a visual aide.  Clinical Observations/Feedback:  Patient came to group and defined values as character and morals. She identified her values as family, pets and work. Participant explained that values are apart of life and they come from the heart. Individual was social with peers and staff while participating in the intervention. Alanie Syler LRT/CTRS         Jasman Pfeifle 02/25/2019 11:57 AM

## 2019-02-25 NOTE — Progress Notes (Signed)
D- Patient alert and oriented. Affect/mood. Denies SI, HI, AVH, and pain. No specific goals set at present.  How did they do with achieving previous goal. A- Scheduled medications administered to patient, per MD orders. Support and encouragement provided.  Routine safety checks conducted every 15 minutes.  Patient informed to notify staff with problems or concerns. R- No adverse drug reactions noted. Patient contracts for safety at this time. Patient compliant with medications and treatment plan.  Patient remains safe at this time.

## 2019-02-25 NOTE — [facility] Suicide Risk Assessment (Signed)
[facility] INPATIENT:  Family/Significant Other Suicide Prevention Education  Suicide Prevention Education:  Education Completed; Staci Righter, mother [phone] has been identified by the patient as the family member/significant other with whom the patient will be residing, and identified as the person(s) who will aid the patient in the event of a mental health crisis (suicidal ideations/suicide attempt).  With written consent from the patient, the family member/significant other has been provided the following suicide prevention education, prior to the and/or following the discharge of the patient.  The suicide prevention education provided includes the following:  Suicide risk factors  Suicide prevention and interventions  National Suicide Hotline telephone number  [facility] assessment telephone number  [facility] Emergency Assistance [place] and/or Residential Mobile Crisis Unit telephone number  Request made of family/significant other to:  Remove weapons (e.g., guns, rifles, knives), all items previously/currently identified as safety concern.    Remove drugs/medications (over-the-counter, prescriptions, illicit drugs), all items previously/currently identified as a safety concern.  The family member/significant other verbalizes understanding of the suicide prevention education information provided.  The family member/significant other agrees to remove the items of safety concern listed above.  CSW spoke with Butch Penny who reported that pt is in the hospital due to pt going through a lot in the last 2 years. Pt lost her job, lost her license, and was going through a separation. Butch Penny reported that pt depends on her for transportation due to not having a license right now. Per Butch Penny, pt got upset because she was not able to take her to work on time, due to having to take her husband to a doctor appointment. Butch Penny reported she has never seen pt so upset. Butch Penny  denied any guns/weapons being in the home and HI concerns. Butch Penny reported worrying about SI since pt overdosed on her medications and reporting that pt cries a lot and gets angry a lot.  Butch Penny reported she has some papers that she will be getting from pts job that will need to be signed by the doctor so pt can keep her job. She reported she would fax them over once she receives them.  [place] MSW LCSW 02/25/2019, 10:26 AM

## 2019-02-25 NOTE — Progress Notes (Signed)
D- Patient alert and Continues depressed with sad facies. . Denies SI, HI, AVH, and pain.. Goal:be able to go home pain free  A- Scheduled medications administered to patient, per MD orders. Support and encouragement provided.  Routine safety checks conducted every 15 minutes.  Patient informed to notify staff with problems or concerns. R- No adverse drug reactions noted. Patient contracts for safety at this time. Patient compliant with medications and treatment plan. Patient receptive, calm, and cooperative.   Patient remains safe at this time.

## 2019-02-25 NOTE — Progress Notes (Signed)
[facility]  02/25/2019 3:27 PM Toni Friedman  MRN:  [id number] Subjective: Follow-up for this patient came into the hospital after taking an overdose of medication and voicing suicidal ideation.  Patient seen chart reviewed.  Patient states that she is "fine".  She minimizes all of her symptoms.  Says that she promises she will never try to harm herself again.  Patient is very concrete and rather superficial in her understanding.  Her affect still looks anxious but she denies any sort of anxious or unpleasant feelings.  She is lucid with no sign of delirium or delusions.  Able to articulate plans for the future.  Seems to genuinely feel like she has things to live for in the future although she remains nervous.  Patient is requesting discharge sooner rather than later.  She has taken medication and has no complaints.  Tolerating it well.  Pain appears to be adequately controlled with no new complaints Principal Problem: Severe episode of recurrent major depressive disorder, without psychotic features (HCC) Diagnosis: Principal Problem:   Severe episode of recurrent major depressive disorder, without psychotic features (HCC) Active Problems:   Suicide attempt (HCC)   Chronic pain syndrome   Cannabis abuse  Total Time spent with patient: 30 minutes  Past Psychiatric History: Patient has no prior psychiatric hospitalizations.  Had been seen by primary care providers in the past primarily for her complaints of chronic back and joint pain.  Past Medical History:  Past Medical History:  Diagnosis Date  . Anxiety   . Arthritis    knees  . Depression   . History of alcohol abuse     Past Surgical History:  Procedure Laterality Date  . KNEE SURGERY Left 2000  . TIBIA IM NAIL INSERTION Right 12/19/2015   Procedure: INTRAMEDULLARY (IM) NAIL TIBIAL;  Surgeon: Kennedy BuckerMichael Menz, MD;  Location: [facility] ORS;  Service: Orthopedics;  Laterality: Right;  . TUBAL LIGATION    . WRIST SURGERY  2000   fracture   Family History:  Family History  Problem Relation Age of Onset  . Lupus Mother   . Diabetes Father   . Cancer Neg Hx   . COPD Neg Hx   . Heart disease Neg Hx   . Hypertension Neg Hx   . Stroke Neg Hx    Family Psychiatric  History: See previous Social History:  Social History   Substance and Sexual Activity  Alcohol Use No   Comment: socially     Social History   Substance and Sexual Activity  Drug Use No    Social History   Socioeconomic History  . Marital status: Single    Spouse name: Not on file  . Number of children: Not on file  . Years of education: Not on file  . Highest education level: Not on file  Occupational History  . Not on file  Social Needs  . Financial resource strain: Not on file  . Food insecurity    Worry: Not on file    Inability: Not on file  . Transportation needs    Medical: Not on file    Non-medical: Not on file  Tobacco Use  . Smoking status: Former Smoker    Packs/day: 0.50  . Smokeless tobacco: Never Used  Substance and Sexual Activity  . Alcohol use: No    Comment: socially  . Drug use: No  . Sexual activity: Not on file  Lifestyle  . Physical activity    Days per week: Not  on file    Minutes per session: Not on file  . Stress: Not on file  Relationships  . Social Musicianconnections    Talks on phone: Not on file    Gets together: Not on file    Attends religious service: Not on file    Active member of club or organization: Not on file    Attends meetings of clubs or organizations: Not on file    Relationship status: Not on file  Other Topics Concern  . Not on file  Social History Narrative  . Not on file   Additional Social History:                         Sleep: Fair  Appetite:  Fair  Current Medications: Current Facility-Administered Medications  Medication Dose Route Frequency Provider Last Rate Last Dose  . acetaminophen (TYLENOL) tablet 650 mg  650 mg Oral Q6H PRN Mariel CraftMaurer, Sheila M, MD    650 mg at 02/25/19 0809  . alum & mag hydroxide-simeth (MAALOX/MYLANTA) 200-200-20 MG/5ML suspension 30 mL  30 mL Oral Q4H PRN Mariel CraftMaurer, Sheila M, MD      . ARIPiprazole (ABILIFY) tablet 5 mg  5 mg Oral Daily Anelle Parlow, Jackquline DenmarkJohn T, MD   5 mg at 02/25/19 0806  . bisacodyl (DULCOLAX) suppository 10 mg  10 mg Rectal Daily PRN Mariel CraftMaurer, Sheila M, MD      . DULoxetine (CYMBALTA) DR capsule 90 mg  90 mg Oral Daily Atharva Mirsky, Jackquline DenmarkJohn T, MD   90 mg at 02/25/19 0806  . folic acid (FOLVITE) tablet 1 mg  1 mg Oral Daily Mariel CraftMaurer, Sheila M, MD   1 mg at 02/25/19 0806  . gabapentin (NEURONTIN) capsule 300 mg  300 mg Oral TID Shemuel Harkleroad, Jackquline DenmarkJohn T, MD   300 mg at 02/25/19 1205  . hydrOXYzine (ATARAX/VISTARIL) tablet 25 mg  25 mg Oral Q4H PRN Mariel CraftMaurer, Sheila M, MD      . ibuprofen (ADVIL) tablet 600 mg  600 mg Oral Q6H PRN Juwann Sherk T, MD      . ipratropium-albuterol (DUONEB) 0.5-2.5 (3) MG/3ML nebulizer solution 3 mL  3 mL Nebulization Q6H PRN Mariel CraftMaurer, Sheila M, MD      . magnesium hydroxide (MILK OF MAGNESIA) suspension 30 mL  30 mL Oral Daily PRN Mariel CraftMaurer, Sheila M, MD      . multivitamin with minerals tablet 1 tablet  1 tablet Oral Daily Mariel CraftMaurer, Sheila M, MD   1 tablet at 02/25/19 0806  . nicotine (NICODERM CQ - dosed in mg/24 hours) patch 21 mg  21 mg Transdermal Daily Mariel CraftMaurer, Sheila M, MD   21 mg at 02/25/19 0807  . pantoprazole (PROTONIX) EC tablet 40 mg  40 mg Oral BID AC Mariel CraftMaurer, Sheila M, MD   40 mg at 02/25/19 0806  . sennosides (SENOKOT) 8.8 MG/5ML syrup 5 mL  5 mL Per Tube BID PRN Mariel CraftMaurer, Sheila M, MD      . thiamine (VITAMIN B-1) tablet 100 mg  100 mg Oral Daily Mariel CraftMaurer, Sheila M, MD   100 mg at 02/25/19 [id number]    Lab Results:  Results for orders placed or performed during the hospital encounter of 02/20/19 (from the past 48 hour(s))  Glucose, capillary     Status: Abnormal   Collection Time: 02/24/19 12:05 AM  Result Value Ref Range   Glucose-Capillary 127 (H) 70 - 99 mg/dL  Glucose, capillary     Status: None    Collection Time:  02/24/19  6:12 AM  Result Value Ref Range   Glucose-Capillary 82 70 - 99 mg/dL    Blood Alcohol level:  Lab Results  Component Value Date   ETH <10 02/40/9735    Metabolic Disorder Labs: No results found for: HGBA1C, MPG No results found for: PROLACTIN No results found for: CHOL, TRIG, HDL, CHOLHDL, VLDL, LDLCALC  Physical Findings: AIMS: Facial and Oral Movements Muscles of Facial Expression: None, normal Lips and Perioral Area: None, normal Jaw: None, normal Tongue: None, normal,Extremity Movements Upper (arms, wrists, hands, fingers): None, normal Lower (legs, knees, ankles, toes): None, normal, Trunk Movements Neck, shoulders, hips: None, normal, Overall Severity Severity of abnormal movements (highest score from questions above): None, normal Incapacitation due to abnormal movements: None, normal Patient's awareness of abnormal movements (rate only patient's report): No Awareness, Dental Status Current problems with teeth and/or dentures?: No Does patient usually wear dentures?: No  CIWA:  CIWA-Ar Total: 0 COWS:     Musculoskeletal: Strength & Muscle Tone: within normal limits Gait & Station: normal Patient leans: N/A  Psychiatric Specialty Exam: Physical Exam  Nursing note and vitals reviewed. Constitutional: She appears well-developed and well-nourished.  HENT:  Head: Normocephalic and atraumatic.  Eyes: Pupils are equal, round, and reactive to light. Conjunctivae are normal.  Neck: Normal range of motion.  Cardiovascular: Regular rhythm and normal heart sounds.  Respiratory: Effort normal.  GI: Soft.  Musculoskeletal: Normal range of motion.  Neurological: She is alert.  Skin: Skin is warm and dry.  Psychiatric: Her mood appears anxious. Her speech is delayed. She is slowed. Thought content is not paranoid. Cognition and memory are normal. She expresses impulsivity. She expresses no homicidal and no suicidal ideation.    Review of Systems   Constitutional: Negative.   HENT: Negative.   Eyes: Negative.   Respiratory: Negative.   Cardiovascular: Negative.   Gastrointestinal: Negative.   Musculoskeletal: Negative.   Skin: Negative.   Neurological: Negative.   Psychiatric/Behavioral: Negative for depression, hallucinations, memory loss, substance abuse and suicidal ideas. The patient is nervous/anxious. The patient does not have insomnia.     Blood pressure 118/88, pulse (!) 107, temperature 98.1 F (36.7 C), temperature source Oral, resp. rate 18, height 5\' 2"  (1.575 m), weight 79.8 kg, SpO2 100 %.Body mass index is 32.19 kg/m.  General Appearance: Casual  Eye Contact:  Good  Speech:  Clear and Coherent  Volume:  Normal  Mood:  Euthymic  Affect:  Constricted  Thought Process:  Goal Directed  Orientation:  Full (Time, Place, and Person)  Thought Content:  Logical  Suicidal Thoughts:  No  Homicidal Thoughts:  No  Memory:  Immediate;   Fair Recent;   Fair Remote;   Fair  Judgement:  Impaired  Insight:  Shallow  Psychomotor Activity:  Decreased  Concentration:  Concentration: Fair  Recall:  [employer] of Knowledge:  Fair  Language:  Fair  Akathisia:  No  Handed:  Right  AIMS (if indicated):     Assets:  Desire for Improvement Housing Social Support  ADL's:  Intact  Cognition:  WNL  Sleep:  Number of Hours: 7     Treatment Plan Summary: Daily contact with patient to assess and evaluate symptoms and progress in treatment, Medication management and Plan Patient continues to have depressed and anxious affect and multiple stresses at home but she is lucid and appears to be able to make her own decisions.  She is absolutely denying any suicidal ideation and begging for  discharge.  Did some educational and supportive counseling helping to try and make it clear to her that she remains at some risk for suicidal behavior or at least bad depression.  Patient is agreeable to outpatient treatment in the community.   Medications have been slightly adjusted and she is agreeable to continuing them at this point.  We will tentatively plan on discharge tomorrow I will try to make sure we have a supply of her medicines available when she leaves.  Mordecai RasmussenJohn Isatu Macinnes, MD 02/25/2019, 3:27 PM

## 2019-02-25 NOTE — Plan of Care (Signed)
Patient  aware of information received on [facility] and unit programs  able to verbalize understanding . Able to vent frustrations and anger appropriately. Patient aware of resources available .   Working on coping , decision anxiety and  skills .Attending  and participation with unit programing  . Able to verbalize feelings  Voice of no safety concerns  No anger management  or control issues . Marland Kitchen  Compliant  with medication, verbalize understanding of  medication received.   Problem: Education: Goal: Knowledge of [facility] Education information/materials will improve Outcome: Progressing   Problem: Coping: Goal: Ability to verbalize frustrations and anger appropriately will improve Outcome: Progressing Goal: Ability to demonstrate self-control will improve Outcome: Progressing   Problem: Safety: Goal: Periods of time without injury will increase Outcome: Progressing   Problem: Medication: Goal: Compliance with prescribed medication regimen will improve Outcome: Progressing   Problem: Activity: Goal: Interest or engagement in leisure activities will improve Outcome: Progressing Goal: Imbalance in normal sleep/wake cycle will improve Outcome: Progressing   Problem: Activity: Goal: Interest or engagement in leisure activities will improve Outcome: Progressing Goal: Imbalance in normal sleep/wake cycle will improve Outcome: Progressing

## 2019-02-26 DIAGNOSIS — F332 Major depressive disorder, recurrent severe without psychotic features: Principal | ICD-10-CM

## 2019-02-26 MED ORDER — DULOXETINE HCL 30 MG PO CPEP: 90.0000 mg | ORAL_CAPSULE | Freq: Every day | ORAL | 1 refills | Status: AC

## 2019-02-26 MED ORDER — GABAPENTIN 300 MG PO CAPS: 300.0000 mg | ORAL_CAPSULE | Freq: Three times a day (TID) | ORAL | 1 refills | Status: AC

## 2019-02-26 MED ORDER — ARIPIPRAZOLE 5 MG PO TABS: 5.0000 mg | ORAL_TABLET | Freq: Every day | ORAL | 1 refills | Status: AC

## 2019-02-26 MED ORDER — PANTOPRAZOLE SODIUM 40 MG PO TBEC: 40.0000 mg | DELAYED_RELEASE_TABLET | Freq: Two times a day (BID) | ORAL | 1 refills | Status: AC

## 2019-02-26 NOTE — BHH Suicide Risk Assessment (Signed)
[facility] Discharge Suicide Risk Assessment   Principal Problem: Severe episode of recurrent major depressive disorder, without psychotic features ([place]) Discharge Diagnoses: Principal Problem:   Severe episode of recurrent major depressive disorder, without psychotic features ([place]) Active Problems:   Suicide attempt ([place])   Chronic pain syndrome   Cannabis abuse   Total Time spent with patient: 45 minutes  Musculoskeletal: Strength & Muscle Tone: within normal limits Gait & Station: normal Patient leans: N/A  Psychiatric Specialty Exam: Review of Systems  Constitutional: Negative.   HENT: Negative.   Eyes: Negative.   Respiratory: Negative.   Cardiovascular: Negative.   Gastrointestinal: Negative.   Musculoskeletal: Negative.   Skin: Negative.   Neurological: Negative.   Psychiatric/Behavioral: Negative for depression, hallucinations, memory loss, substance abuse and suicidal ideas. The patient is not nervous/anxious and does not have insomnia.     Blood pressure (!) 126/96, pulse 94, temperature 98.5 F (36.9 C), temperature source Oral, resp. rate 18, height 5\' 2"  (1.575 m), weight 79.8 kg, SpO2 100 %.Body mass index is 32.19 kg/m.  General Appearance: Casual  Eye Contact::  Good  Speech:  Clear and [id number]  Volume:  Normal  Mood:  Euthymic  Affect:  Congruent  Thought Process:  Goal Directed  Orientation:  Full (Time, Place, and Person)  Thought Content:  Logical  Suicidal Thoughts:  No  Homicidal Thoughts:  No  Memory:  Immediate;   Fair Recent;   Fair Remote;   Fair  Judgement:  Fair  Insight:  Fair  Psychomotor Activity:  Decreased  Concentration:  Fair  Recall:  [employer] of Knowledge:Fair  Language: Fair  Akathisia:  No  Handed:  Right  AIMS (if indicated):     Assets:  Desire for Improvement Housing Physical Health Social Support  Sleep:  Number of Hours: 5.15  Cognition: WNL  ADL's:  Intact   Mental Status Per Nursing Assessment::   On  Admission:  NA  Demographic Factors:  Caucasian  Loss Factors: Financial problems/change in socioeconomic status  Historical Factors: Impulsivity  Risk Reduction Factors:   Employed, Living with another person, especially a relative and Positive social support  Continued Clinical Symptoms:  Depression:   Impulsivity  Cognitive Features That Contribute To Risk:  Closed-mindedness    Suicide Risk:  Minimal: No identifiable suicidal ideation.  Patients presenting with no risk factors but with morbid ruminations; may be classified as minimal risk based on the severity of the depressive symptoms  Follow-up Information    [facility] Follow up.   Why: If you change your mind about mental health treatment. Walk in hours are Monday-Friday from 9:00AM-4:00PM. Thank you! Contact information: [place] [id number] [phone]           Plan Of Care/Follow-up recommendations:  Activity:  Activity as tolerated Diet:  Regular diet Other:  Outpatient follow-up as indicated above continue current medicine.  Alethia Berthold, MD 02/26/2019, 8:52 AM

## 2019-02-26 NOTE — Plan of Care (Signed)
Problem: Medication: Goal: Compliance with prescribed medication regimen will improve Outcome: Progressing Patient is compliant with prescribed medication regimen.   

## 2019-02-26 NOTE — Discharge Summary (Signed)
Physician Discharge Summary Note  Patient:  Toni Friedman is an 38 y.o., female MRN:  [id number] DOB:  10/24/1980 Patient phone:  [phone] (home)  [place]   [address] [place] [id number],  Total Time spent with patient: 45 minutes  Date of Admission:  02/24/2019 Date of Discharge: February 26, 2019  Reason for Admission: Admitted in transfer from the medical service after stabilization of an overdose  Principal Problem: Severe episode of recurrent major depressive disorder, without psychotic features [facility](HCC) Discharge Diagnoses: Principal Problem:   Severe episode of recurrent major depressive disorder, without psychotic features (HCC) Active Problems:   Suicide attempt (HCC)   Chronic pain syndrome   Cannabis abuse   Past Psychiatric History: Past history of chronic anxiety and depression  Past Medical History:  Past Medical History:  Diagnosis Date  . Anxiety   . Arthritis    knees  . Depression   . History of alcohol abuse     Past Surgical History:  Procedure Laterality Date  . KNEE SURGERY Left 2000  . TIBIA IM NAIL INSERTION Right 12/19/2015   Procedure: INTRAMEDULLARY (IM) NAIL TIBIAL;  Surgeon: Kennedy BuckerMichael Menz, MD;  Location: [facility] ORS;  Service: Orthopedics;  Laterality: Right;  . TUBAL LIGATION    . WRIST SURGERY  2000   fracture   Family History:  Family History  Problem Relation Age of Onset  . Lupus Mother   . Diabetes Father   . Cancer Neg Hx   . COPD Neg Hx   . Heart disease Neg Hx   . Hypertension Neg Hx   . Stroke Neg Hx    Family Psychiatric  History: None Social History:  Social History   Substance and Sexual Activity  Alcohol Use No   Comment: socially     Social History   Substance and Sexual Activity  Drug Use No    Social History   Socioeconomic History  . Marital status: Single    Spouse name: Not on file  . Number of children: Not on file  . Years of education: Not on file  . Highest education level: Not on file   Occupational History  . Not on file  Social Needs  . Financial resource strain: Not on file  . Food insecurity    Worry: Not on file    Inability: Not on file  . Transportation needs    Medical: Not on file    Non-medical: Not on file  Tobacco Use  . Smoking status: Former Smoker    Packs/day: 0.50  . Smokeless tobacco: Never Used  Substance and Sexual Activity  . Alcohol use: No    Comment: socially  . Drug use: No  . Sexual activity: Not on file  Lifestyle  . Physical activity    Days per week: Not on file    Minutes per session: Not on file  . Stress: Not on file  Relationships  . Social Musicianconnections    Talks on phone: Not on file    Gets together: Not on file    Attends religious service: Not on file    Active member of club or organization: Not on file    Attends meetings of clubs or organizations: Not on file    Relationship status: Not on file  Other Topics Concern  . Not on file  Social History Narrative  . Not on file    Hospital Course: Patient admitted to the hospital and maintained on 15-minute checks.  Did  not show any dangerous or aggressive behavior in the hospital.  Denied any suicidal ideation.  She was agreeable to starting antidepressant medication and tolerated it without difficulty.  Patient interacted with peers appropriately.  Psychoeducation was done about the importance of staying involved in outpatient treatment.  Medication was started and psychoeducation was completed about medication use.  Patient was agreeable to follow-up treatment.  At the time of discharge was lucid completely denying suicidal ideation did not appear to be in acute danger to self or others.  Physical Findings: AIMS: Facial and Oral Movements Muscles of Facial Expression: None, normal Lips and Perioral Area: None, normal Jaw: None, normal Tongue: None, normal,Extremity Movements Upper (arms, wrists, hands, fingers): None, normal Lower (legs, knees, ankles, toes): None,  normal, Trunk Movements Neck, shoulders, hips: None, normal, Overall Severity Severity of abnormal movements (highest score from questions above): None, normal Incapacitation due to abnormal movements: None, normal Patient's awareness of abnormal movements (rate only patient's report): No Awareness, Dental Status Current problems with teeth and/or dentures?: No Does patient usually wear dentures?: No  CIWA:  CIWA-Ar Total: 0 COWS:     Musculoskeletal: Strength & Muscle Tone: within normal limits Gait & Station: normal Patient leans: N/A  Psychiatric Specialty Exam: Physical Exam  Nursing note and vitals reviewed. Constitutional: She appears well-developed and well-nourished.  HENT:  Head: Normocephalic and atraumatic.  Eyes: Pupils are equal, round, and reactive to light. Conjunctivae are normal.  Neck: Normal range of motion.  Cardiovascular: Normal heart sounds.  Respiratory: Effort normal.  GI: Soft.  Musculoskeletal: Normal range of motion.  Neurological: She is alert.  Skin: Skin is warm and dry.    Review of Systems  Constitutional: Negative.   HENT: Negative.   Eyes: Negative.   Respiratory: Negative.   Cardiovascular: Negative.   Gastrointestinal: Negative.   Musculoskeletal: Negative.   Skin: Negative.   Neurological: Negative.   Psychiatric/Behavioral: Negative.     Blood pressure (!) 126/96, pulse 94, temperature 98.5 F (36.9 C), temperature source Oral, resp. rate 18, height 5\' 2"  (1.575 m), weight 79.8 kg, SpO2 100 %.Body mass index is 32.19 kg/m.  General Appearance: Casual  Eye Contact:  Good  Speech:  Clear and Coherent  Volume:  Normal  Mood:  Euthymic  Affect:  Constricted  Thought Process:  Goal Directed  Orientation:  Full (Time, Place, and Person)  Thought Content:  Logical  Suicidal Thoughts:  No  Homicidal Thoughts:  No  Memory:  Immediate;   Fair Recent;   Fair Remote;   Fair  Judgement:  Fair  Insight:  Fair  Psychomotor Activity:   Normal  Concentration:  Concentration: Fair  Recall:  [employer]  Fund of Knowledge:  Fair  Language:  Fair  Akathisia:  No  Handed:  Right  AIMS (if indicated):     Assets:  Desire for Improvement  ADL's:  Intact  Cognition:  WNL  Sleep:  Number of Hours: 5.15     Have you used any form of tobacco in the last 30 days? (Cigarettes, Smokeless Tobacco, Cigars, and/or Pipes): Yes  Has this patient used any form of tobacco in the last 30 days? (Cigarettes, Smokeless Tobacco, Cigars, and/or Pipes) Yes, No  Blood Alcohol level:  Lab Results  Component Value Date   ETH <10 02/20/2019    Metabolic Disorder Labs:  No results found for: HGBA1C, MPG No results found for: PROLACTIN No results found for: CHOL, TRIG, HDL, CHOLHDL, VLDL, LDLCALC  See Psychiatric Specialty Exam  and Suicide Risk Assessment completed by Attending Physician prior to discharge.  Discharge destination:  Home  Is patient on multiple antipsychotic therapies at discharge:  No   Has Patient had three or more failed trials of antipsychotic monotherapy by history:  No  Recommended Plan for Multiple Antipsychotic Therapies: NA  Discharge Instructions    Diet - low sodium heart healthy   Complete by: As directed    Increase activity slowly   Complete by: As directed      Allergies as of 02/26/2019   No Known Allergies     Medication List    STOP taking these medications   cyclobenzaprine 10 MG tablet Commonly known as: FLEXERIL   nicotine 21 mg/24hr patch Commonly known as: NICODERM CQ - dosed in mg/24 hours   nicotine polacrilex 2 MG gum Commonly known as: NICORETTE     TAKE these medications     Indication  ARIPiprazole 5 MG tablet Commonly known as: ABILIFY Take 1 tablet (5 mg total) by mouth daily. What changed:   medication strength  how much to take  Indication: Major Depressive Disorder   DULoxetine 30 MG capsule Commonly known as: CYMBALTA Take 3 capsules (90 mg total) by mouth  daily. What changed:   medication strength  how much to take  Indication: Major Depressive Disorder, Musculoskeletal Pain   gabapentin 300 MG capsule Commonly known as: NEURONTIN Take 1 capsule (300 mg total) by mouth 3 (three) times daily.  Indication: Fibromyalgia Syndrome   pantoprazole 40 MG tablet Commonly known as: PROTONIX Take 1 tablet (40 mg total) by mouth 2 (two) times daily before a meal.  Indication: Gastroesophageal Reflux Disease      Follow-up Information    [facility] Follow up.   Why: If you change your mind about mental health treatment. Walk in hours are Monday-Friday from 9:00AM-4:00PM. Thank you! Contact information: [address] [place] [address] [phone]           Follow-up recommendations:  Activity:  Activity as tolerated Diet:  Regular diet Other:  Continue current medicine follow-up with [facility].  Comments: Psychoeducation about depression and stress reduction and the importance of monitoring herself for worsening mood.  Patient was denying any suicidal ideation at discharge.  Signed: Alethia Berthold, MD 02/26/2019, 6:27 PM

## 2019-02-26 NOTE — Progress Notes (Signed)
  [facility] Adult Case Management Discharge Plan :  Will you be returning to the same living situation after discharge:  Yes,  home At discharge, do you have transportation home?: Yes,  mother will pick pt up Do you have the ability to pay for your medications: Yes,  medicaid  Release of information consent forms completed and in the chart;    Patient to Follow up at: Follow-up Information    [facility] Follow up.   Why: If you change your mind about mental health treatment. Walk in hours are Monday-Friday from 9:00AM-4:00PM. Thank you! Contact information: [place] [id number] [phone]           Next level of care provider has access to [place] and Suicide Prevention discussed: Yes,  SPE completed with pts mother  Have you used any form of tobacco in the last 30 days? (Cigarettes, Smokeless Tobacco, Cigars, and/or Pipes): Yes  Has patient been referred to the Quitline?: Patient refused referral  Patient has been referred for addiction treatment: N/A  Delfin Edis, LCSW 02/26/2019, 9:16 AM

## 2019-02-26 NOTE — Progress Notes (Signed)
Recreation Therapy Notes  INPATIENT RECREATION TR PLAN  Patient Details Name: Toni Friedman MRN: [id number] DOB: May 01, 1981 Today's Date: 02/26/2019  Rec Therapy Plan Is patient appropriate for Therapeutic Recreation?: Yes Treatment times per week: At least 3 Estimated Length of Stay: 5-7 Days TR Treatment/Interventions: Group participation (Comment)  Discharge Criteria Pt will be discharged from therapy if:: Discharged Treatment plan/goals/alternatives discussed and agreed upon by:: Patient/family  Discharge Summary Short term goals set: Patient will identify 3 triggers for depression within 5 recreation therapy group sessions Short term goals met: Adequate for discharge Progress toward goals comments: Groups attended Which groups?: Other (Comment)(Values) Reason goals not met: N/A Therapeutic equipment acquired: N/A Reason patient discharged from therapy: Discharge from hospital Pt/family agrees with progress & goals achieved: Yes Date patient discharged from therapy: 02/26/19   Deshanti Adcox 02/26/2019, 11:36 AM

## 2019-02-26 NOTE — Progress Notes (Signed)
Patient alert and oriented x 4, affect is blunted , thoughts are organized and coherent, she denies SI/HI/AVH,  she appears anxious, Irritable and needy. Patient was noted at the nursing station with different request, mostly requesting for medication before time,  asking writer multiple times to call MD for narcotics. MD was called he explained  He wasn't giving narcotics at this time and encouraged patient to take prescribed tylenol or ibuprofen. Patient became irritated and anxious, she was given medication for anxiety and Insomnia,  she attended evening wrap up group and was appropriate, 15 minutes safety checks maintained will continue to monitor.

## 2019-08-05 ENCOUNTER — Other Ambulatory Visit: Payer: Self-pay | Admitting: Pediatrics

## 2019-08-05 DIAGNOSIS — N644 Mastodynia: Secondary | ICD-10-CM

## 2019-09-30 IMAGING — DX PORTABLE CHEST - 1 VIEW
1 series · 1 of 1 positions shown · non-contrast
Comparison: 02/20/2019

CLINICAL DATA: Intubation

EXAM:
PORTABLE CHEST 1 VIEW

[chest ap]
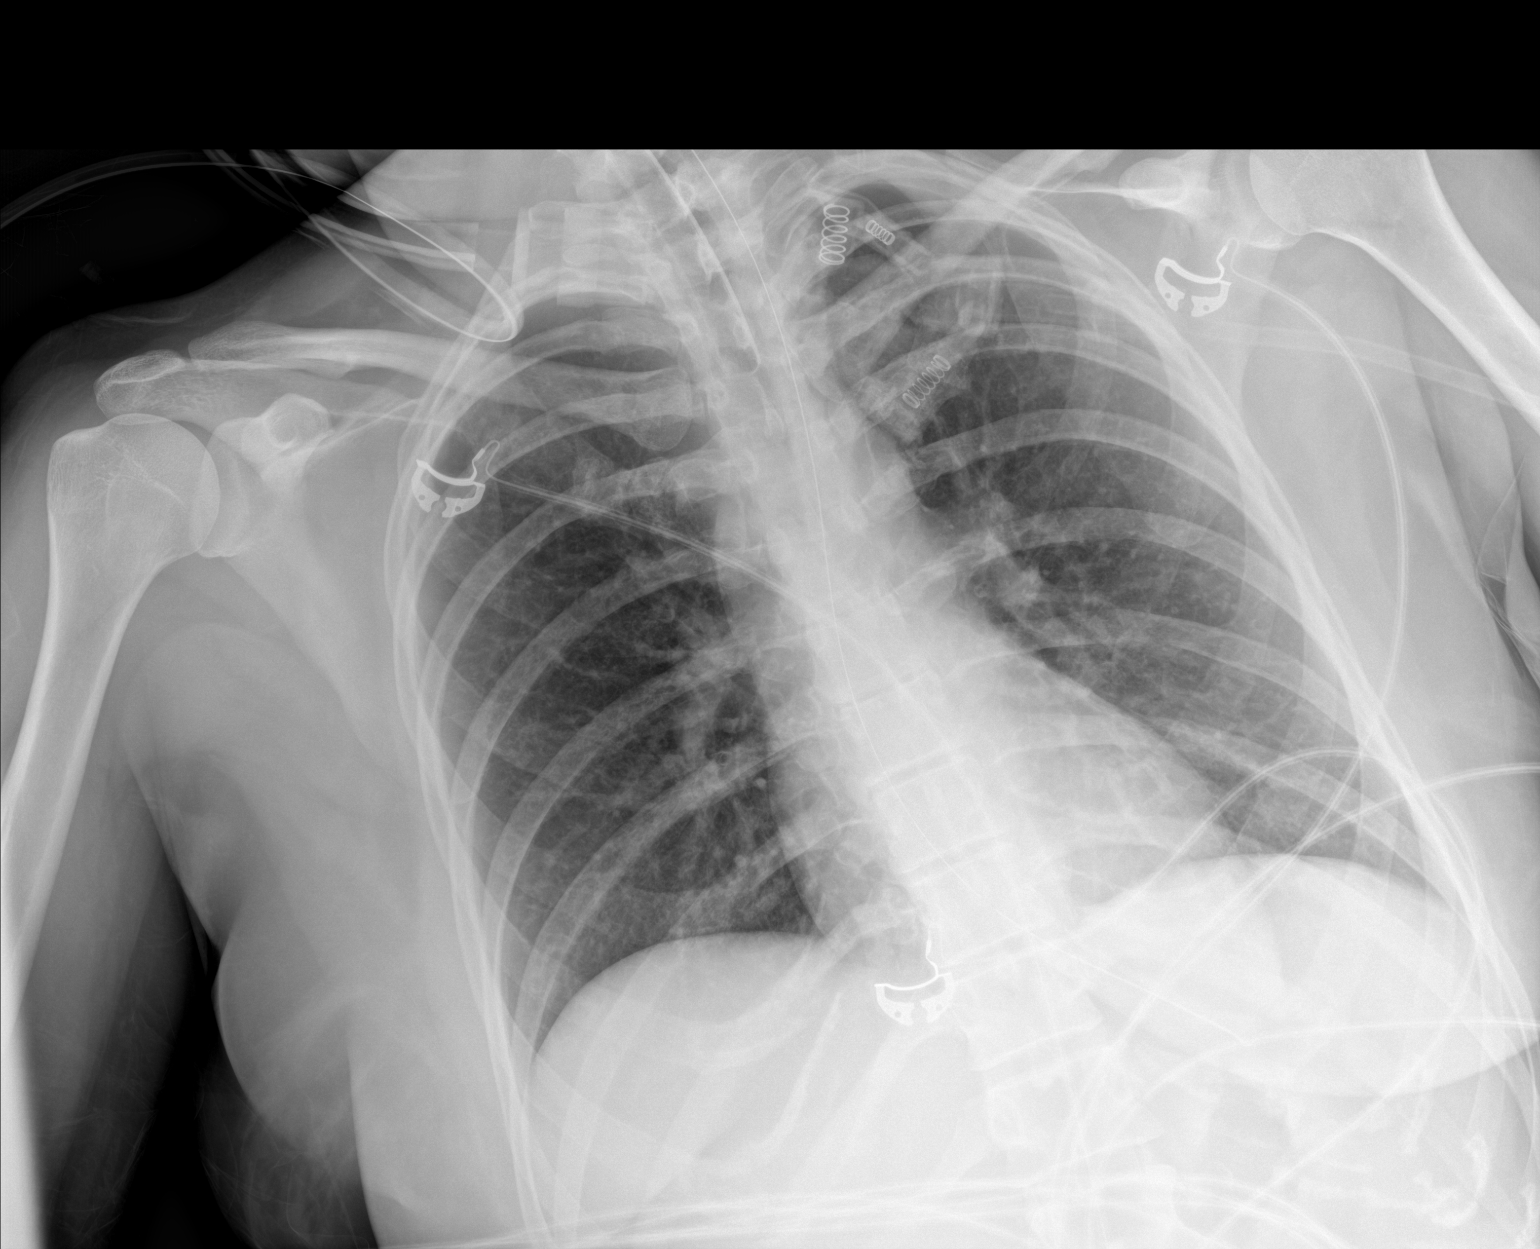

[1 of 1 positions shown; findings below may reference images not displayed]

FINDINGS: Unchanged position of endotracheal tube with tip at the level of the
clavicular heads. Orogastric tube side port is at the
gastroesophageal junction. No focal airspace consolidation or
pulmonary edema.
IMPRESSION: Endotracheal tube tip at the level of the clavicular heads.

## 2019-09-30 IMAGING — DX ABDOMEN - 1 VIEW
1 series · 1 of 1 positions shown · non-contrast
Comparison: Abdominal radiograph dated 02/21/2019

CLINICAL DATA: 37-year-old female with enteric tube placement.

EXAM:
ABDOMEN - 1 VIEW

[abdomen supine]
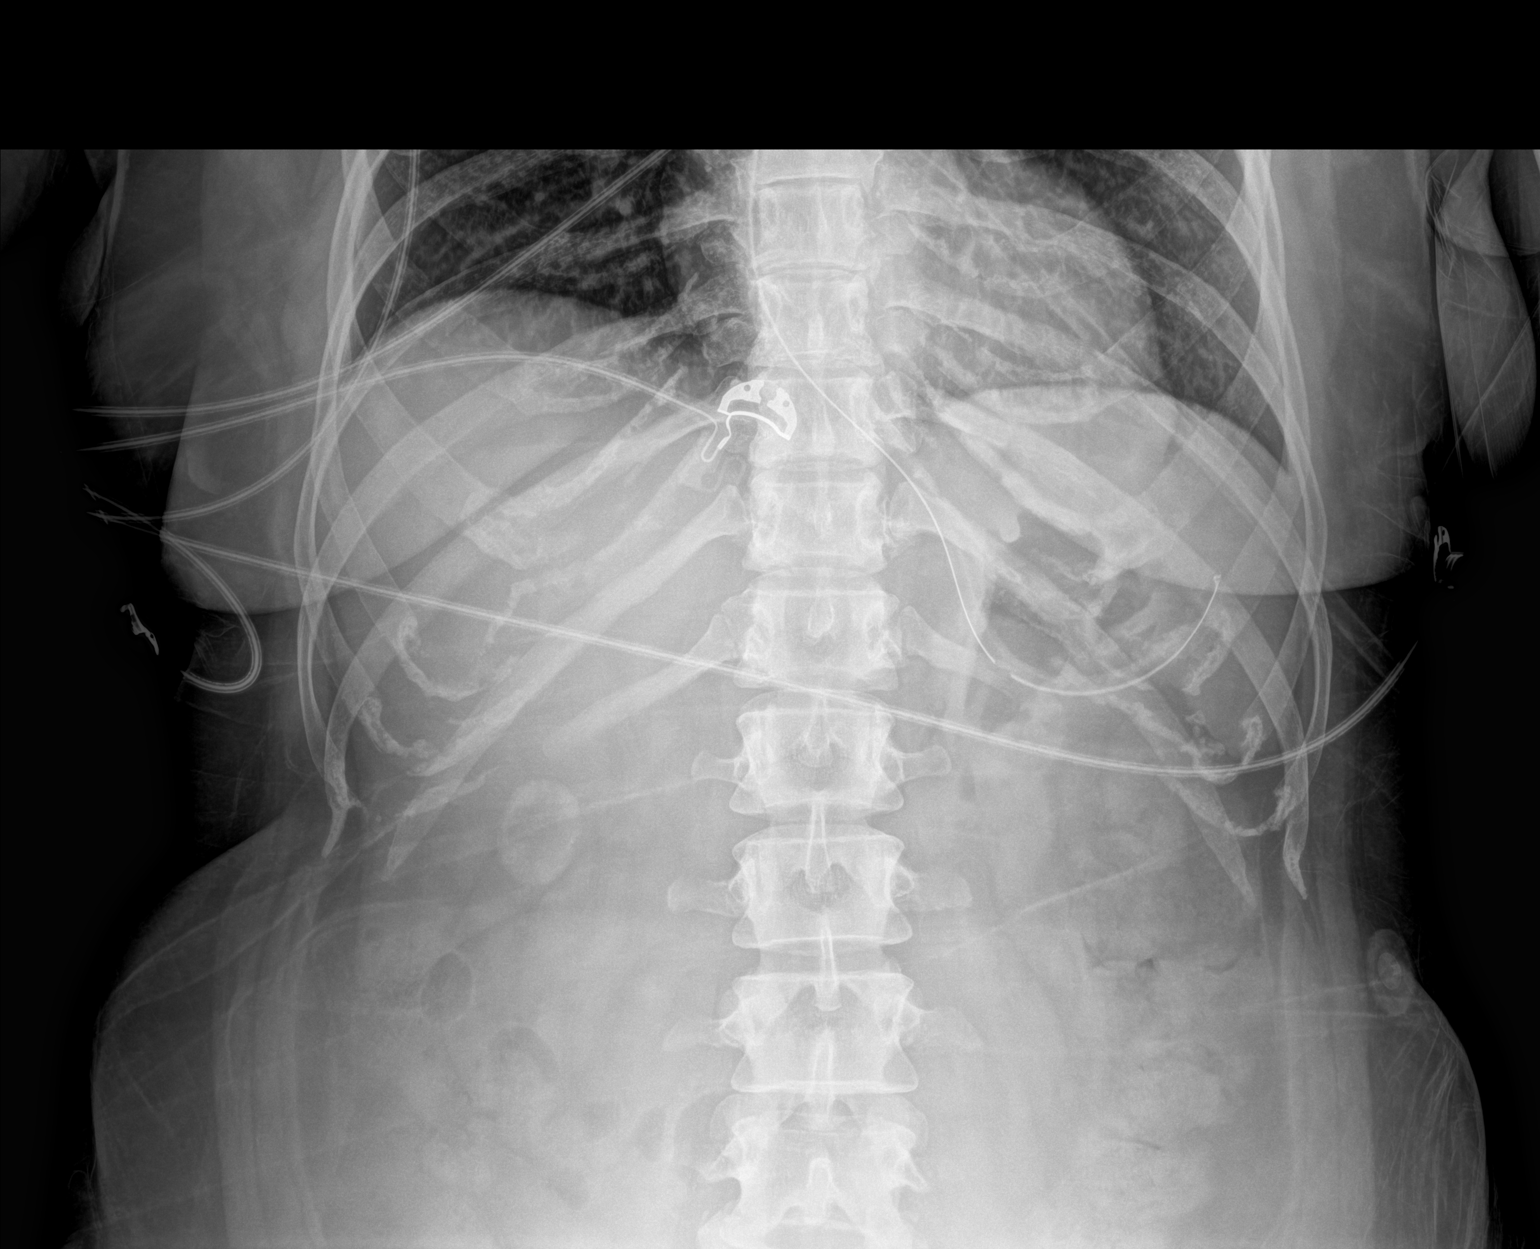

[1 of 1 positions shown; findings below may reference images not displayed]

FINDINGS: Partially visualized enteric tube with tip and side-port in the left
upper abdomen likely within the stomach. There is no bowel
dilatation. Large amount of stool noted within the colon. There is a
3 cm radiopaque density in the right upper quadrant, likely a
gallstone. The osseous structures and soft tissues are grossly
unremarkable.
IMPRESSION: 1. Enteric tube with tip and side-port in the region of the stomach.
2. Probable gallstone.

## 2019-09-30 IMAGING — DX ABDOMEN - 1 VIEW
1 series · 1 of 1 positions shown · non-contrast
Comparison: None.

CLINICAL DATA: Orogastric tube placement

EXAM:
ABDOMEN - 1 VIEW

[abdomen supine]
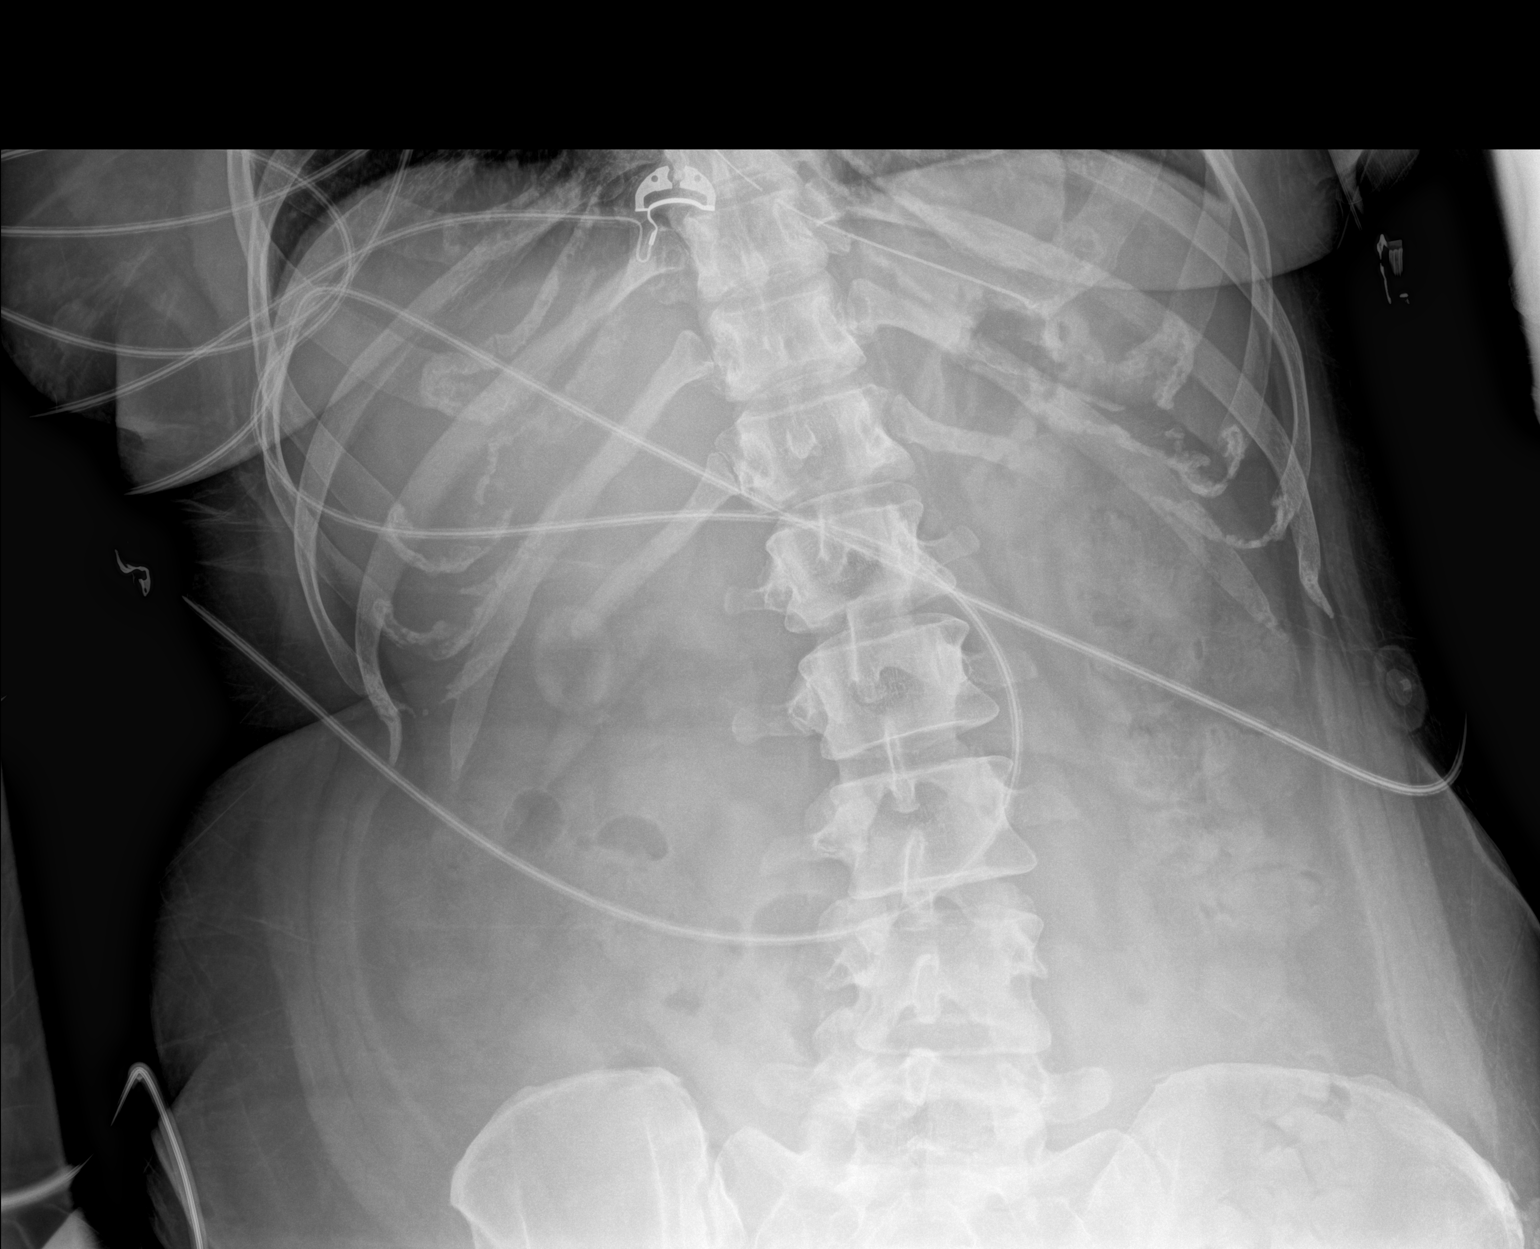

[1 of 1 positions shown; findings below may reference images not displayed]

FINDINGS: Side port of orogastric tube is at the gastroesophageal junction.
Advancement by 7 cm recommended. Normal bowel gas pattern.
IMPRESSION: Orogastric tube side port at the gastroesophageal junction.
Recommend advancing by 7 cm.

## 2020-05-31 ENCOUNTER — Other Ambulatory Visit: Payer: Self-pay

## 2020-05-31 ENCOUNTER — Ambulatory Visit
Payer: Medicaid Other | Attending: Student in an Organized Health Care Education/Training Program | Admitting: Student in an Organized Health Care Education/Training Program

## 2020-05-31 ENCOUNTER — Encounter: Payer: Self-pay | Admitting: Student in an Organized Health Care Education/Training Program

## 2020-05-31 VITALS — BP 113/76 | HR 80 | Temp 97.9°F | Resp 16 | Ht 62.0 in | Wt 177.0 lb

## 2020-05-31 DIAGNOSIS — M25561 Pain in right knee: Secondary | ICD-10-CM | POA: Diagnosis present

## 2020-05-31 DIAGNOSIS — M25562 Pain in left knee: Secondary | ICD-10-CM | POA: Insufficient documentation

## 2020-05-31 DIAGNOSIS — G894 Chronic pain syndrome: Secondary | ICD-10-CM | POA: Diagnosis not present

## 2020-05-31 DIAGNOSIS — G8929 Other chronic pain: Secondary | ICD-10-CM | POA: Diagnosis present

## 2020-05-31 DIAGNOSIS — M1712 Unilateral primary osteoarthritis, left knee: Secondary | ICD-10-CM | POA: Diagnosis present

## 2020-05-31 NOTE — Patient Instructions (Addendum)
You will have xray of LEFT knee prior to next appt. With pain clinic.  Sodium Hyaluronate intra-articular injection What is this medicine? SODIUM HYALURONATE (SOE dee um hye al yoor ON ate) is used to treat pain in the knee due to osteoarthritis. This medicine may be used for other purposes; ask your health care provider or pharmacist if you have questions. COMMON BRAND NAME(S): Amvisc, DUROLANE, Euflexxa, GELSYN-3, Hyalgan, Hymovis, Monovisc, Orthovisc, Supartz, Supartz FX, TriVisc, VISCO What should I tell my health care provider before I take this medicine? They need to know if you have any of these conditions:  bleeding disorders  glaucoma  infection in the knee joint  skin conditions or sensitivity  skin infection  an unusual allergic reaction to sodium hyaluronate, other medicines, foods, dyes, or preservatives. Different brands of sodium hyaluronate contain different allergens. Some may contain egg. Talk to your doctor about your allergies to make sure that you get the right product.  pregnant or trying to get pregnant  breast-feeding How should I use this medicine? This medicine is for injection into the knee joint. It is given by a health care professional in a hospital or clinic setting. Talk to your pediatrician regarding the use of this medicine in children. Special care may be needed. Overdosage: If you think you have taken too much of this medicine contact a poison control center or emergency room at once. NOTE: This medicine is only for you. Do not share this medicine with others. What if I miss a dose? This does not apply. What may interact with this medicine? Interactions are not expected. This list may not describe all possible interactions. Give your health care provider a list of all the medicines, herbs, non-prescription drugs, or dietary supplements you use. Also tell them if you smoke, drink alcohol, or use illegal drugs. Some items may interact with your  medicine. What should I watch for while using this medicine? Tell your doctor or healthcare professional if your symptoms do not start to get better or if they get worse. If receiving this medicine for osteoarthritis, limit your activity after you receive your injection. Avoid physical activity for 48 hours following your injection to keep your knee from swelling. Do not stand on your feet for more than 1 hour at a time during the first 48 hours following your injection. Ask your doctor or healthcare professional about when you can begin major physical activity again. What side effects may I notice from receiving this medicine? Side effects that you should report to your doctor or health care professional as soon as possible:  allergic reactions like skin rash, itching or hives, swelling of the face, lips, or tongue  dizziness  facial flushing  pain, tingling, numbness in the hands or feet  vision changes if received this medicine during eye surgery Side effects that usually do not require medical attention (report to your doctor or health care professional if they continue or are bothersome):  back pain  bruising at site where injected  chills  diarrhea  fever  headache  joint pain  joint stiffness  joint swelling  muscle cramps  muscle pain  nausea, vomiting  pain, redness, or irritation at site where injected  weak or tired This list may not describe all possible side effects. Call your doctor for medical advice about side effects. You may report side effects to FDA at 1-800-FDA-1088. Where should I keep my medicine? This drug is given in a hospital or clinic and will not  be stored at home. NOTE: This sheet is a summary. It may not cover all possible information. If you have questions about this medicine, talk to your doctor, pharmacist, or health care provider.  2020 Elsevier/Gold Standard (2015-10-05 08:34:51)

## 2020-05-31 NOTE — Progress Notes (Signed)
Safety precautions to be maintained throughout the outpatient stay will include: orient to surroundings, keep bed in low position, maintain call bell within reach at all times, provide assistance with transfer out of bed and ambulation.  
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
